# Patient Record
Sex: Female | Born: 1990 | Hispanic: No | Marital: Single | Smoking: Never smoker
Health system: Southern US, Community
[De-identification: ages and names within clinical notes are randomized; demographics above are authoritative.]

## PROBLEM LIST (undated history)

## (undated) DIAGNOSIS — Z789 Other specified health status: Secondary | ICD-10-CM

## (undated) HISTORY — DX: Other specified health status: Z78.9

---

## 2016-10-30 HISTORY — PX: OPEN REDUCTION SHOULDER DISLOCATION: SUR900

## 2017-10-01 ENCOUNTER — Encounter (HOSPITAL_COMMUNITY): Payer: Self-pay | Admitting: Emergency Medicine

## 2017-10-01 ENCOUNTER — Emergency Department (HOSPITAL_COMMUNITY)
Admission: EM | Admit: 2017-10-01 | Discharge: 2017-10-01 | Disposition: A | Discharge status: Home / Self Care | Payer: BLUE CROSS/BLUE SHIELD | Attending: Emergency Medicine | Admitting: Emergency Medicine

## 2017-10-01 ENCOUNTER — Emergency Department (HOSPITAL_COMMUNITY): Payer: BLUE CROSS/BLUE SHIELD

## 2017-10-01 DIAGNOSIS — J180 Bronchopneumonia, unspecified organism: Secondary | ICD-10-CM | POA: Diagnosis not present

## 2017-10-01 DIAGNOSIS — R079 Chest pain, unspecified: Secondary | ICD-10-CM | POA: Insufficient documentation

## 2017-10-01 DIAGNOSIS — R05 Cough: Secondary | ICD-10-CM | POA: Diagnosis present

## 2017-10-01 MED ORDER — BENZONATATE 100 MG PO CAPS: 100.0000 mg | ORAL_CAPSULE | Freq: Three times a day (TID) | ORAL | 0 refills | Status: DC

## 2017-10-01 MED ORDER — AZITHROMYCIN 250 MG PO TABS: 250.0000 mg | ORAL_TABLET | Freq: Every day | ORAL | 0 refills | Status: DC

## 2017-10-01 NOTE — ED Triage Notes (Signed)
At time of DC Pt upset and requested days off from work to get better.  Pt was provided  with another work note.

## 2017-10-01 NOTE — ED Provider Notes (Signed)
[facility]-EMERGENCY DEPT Provider Note   CSN: [id number] Arrival date & time: 10/01/17  1126     History   Chief Complaint Chief Complaint  Patient presents with  . Chest Pain  . Cough    HPI Andrea Mosley is a 26 y.o. female.  HPI Patient with no significant medical history comes in with chief complaint of cough. Patient reports having a productive cough with yellow phlegm for the past 4 or 5 days, and patient has associated midsternal chest discomfort. Patient's chest pain is worse with cough and with deep inspiration. Patient is having subjective fevers. She has had sick contacts.  History reviewed. No pertinent past medical history.  There are no active problems to display for this patient.   History reviewed. No pertinent surgical history.  OB History    No data available       Home Medications    Prior to Admission medications   Medication Sig Start Date End Date Taking? Authorizing Provider  PE-DM-APAP & Doxylamin-DM-APAP (VICKS DAYQUIL/NYQUIL CLD & FLU) (Liquid) MISC Take 30 mLs by mouth every 6 (six) hours as needed (cold and cough).   Yes [provider]  azithromycin (ZITHROMAX) 250 MG tablet Take 1 tablet (250 mg total) by mouth daily. Take first 2 tablets together, then 1 every day until finished. 10/01/17   Derwood Kaplan, MD  benzonatate (TESSALON) 100 MG capsule Take 1 capsule (100 mg total) by mouth every 8 (eight) hours. 10/01/17   Derwood Kaplan, MD    Family History No family history on file.  Social History Social History  Substance Use Topics  . Smoking status: Not on file  . Smokeless tobacco: Not on file  . Alcohol use Not on file     Allergies   Prednisone   Review of Systems Review of Systems  Constitutional: Positive for activity change.  Respiratory: Positive for cough.   Cardiovascular: Positive for chest pain.  Allergic/Immunologic: Negative for immunocompromised state.     Physical Exam Updated Vital  Signs BP 104/79 (BP Location: Left Arm)   Pulse 94   Temp 98.3 F (36.8 C) (Oral)   Resp 16   Ht  (1.727 m)   Wt 117.9 kg (260 lb)   LMP  (Within Months) Comment: Birth control   SpO2 96%   BMI 39.53 kg/m   Physical Exam  Constitutional: She is oriented to person, place, and time. She appears well-developed.  HENT:  Head: Normocephalic and atraumatic.  Eyes: EOM are normal.  Neck: Normal range of motion. Neck supple.  Cardiovascular: Normal rate.   Pulmonary/Chest: Effort normal.  rhonchi  Abdominal: Bowel sounds are normal.  Neurological: She is alert and oriented to person, place, and time.  Skin: Skin is warm and dry.  Nursing note and vitals reviewed.    ED Treatments / Results  Labs (all labs ordered are listed, but only abnormal results are displayed) Labs Reviewed - No data to display  EKG  EKG Interpretation None       Radiology Dg Chest 2 View  Result Date: 10/01/2017 CLINICAL DATA:  26 year old female with productive cough, chest pain, chills. EXAM: CHEST  2 VIEW COMPARISON:  None. FINDINGS: Normal lung volumes. Normal cardiac size and mediastinal contours. Visualized tracheal air column is within normal limits. Mildly asymmetric streaky left perihilar opacity. No consolidation or pleural effusion. No pneumothorax. The right lung appears clear. No acute osseous abnormality identified. Negative visible bowel gas pattern. IMPRESSION: Asymmetric streaky left perihilar opacity favored  due to left lung bronchopneumonia in this clinical setting. No pleural effusion. Electronically Signed   By: Odessa Fleming M.D.   On: 10/01/2017 13:45    Procedures Procedures (including critical care time)  Medications Ordered in ED Medications - No data to display   Initial Impression / Assessment and Plan / ED Course  I have reviewed the triage vital signs and the nursing notes.  Pertinent labs & imaging results that were available during my care of the patient were  reviewed by me and considered in my medical decision making (see chart for details).     Patient comes in with chief complaint of cough, she has some rhonchus breath sounds. Cough is productive with thick yellow sputum. Chest x-ray does show evidence of bronchopneumonia on the left side. We'll start patient on CAP Treatment.  Final Clinical Impressions(s) / ED Diagnoses   Final diagnoses:  Bronchopneumonia    New Prescriptions New Prescriptions   AZITHROMYCIN (ZITHROMAX) 250 MG TABLET    Take 1 tablet (250 mg total) by mouth daily. Take first 2 tablets together, then 1 every day until finished.   BENZONATATE (TESSALON) 100 MG CAPSULE    Take 1 capsule (100 mg total) by mouth every 8 (eight) hours.     Derwood Kaplan, MD 10/01/17 [phone]

## 2017-10-01 NOTE — Discharge Instructions (Signed)
Xrays show an infection of the lung. Please start the antibiotics prescribed.  Return to the ER if the symptoms get worse. See your primary doctor in 1 week.

## 2017-10-01 NOTE — ED Triage Notes (Signed)
Pt reports central chest pain, chills and productive cough, resp e/u, nad.

## 2017-10-01 NOTE — ED Notes (Signed)
Declined W/C at D/C and was escorted to lobby by RN. 

## 2017-10-12 IMAGING — DX DG CHEST 2V
2 series · 2 of 2 positions shown · non-contrast
Comparison: None.

CLINICAL DATA: 25-year-old female with productive cough, chest
pain, chills.

EXAM:
CHEST  2 VIEW

[w chest lat]
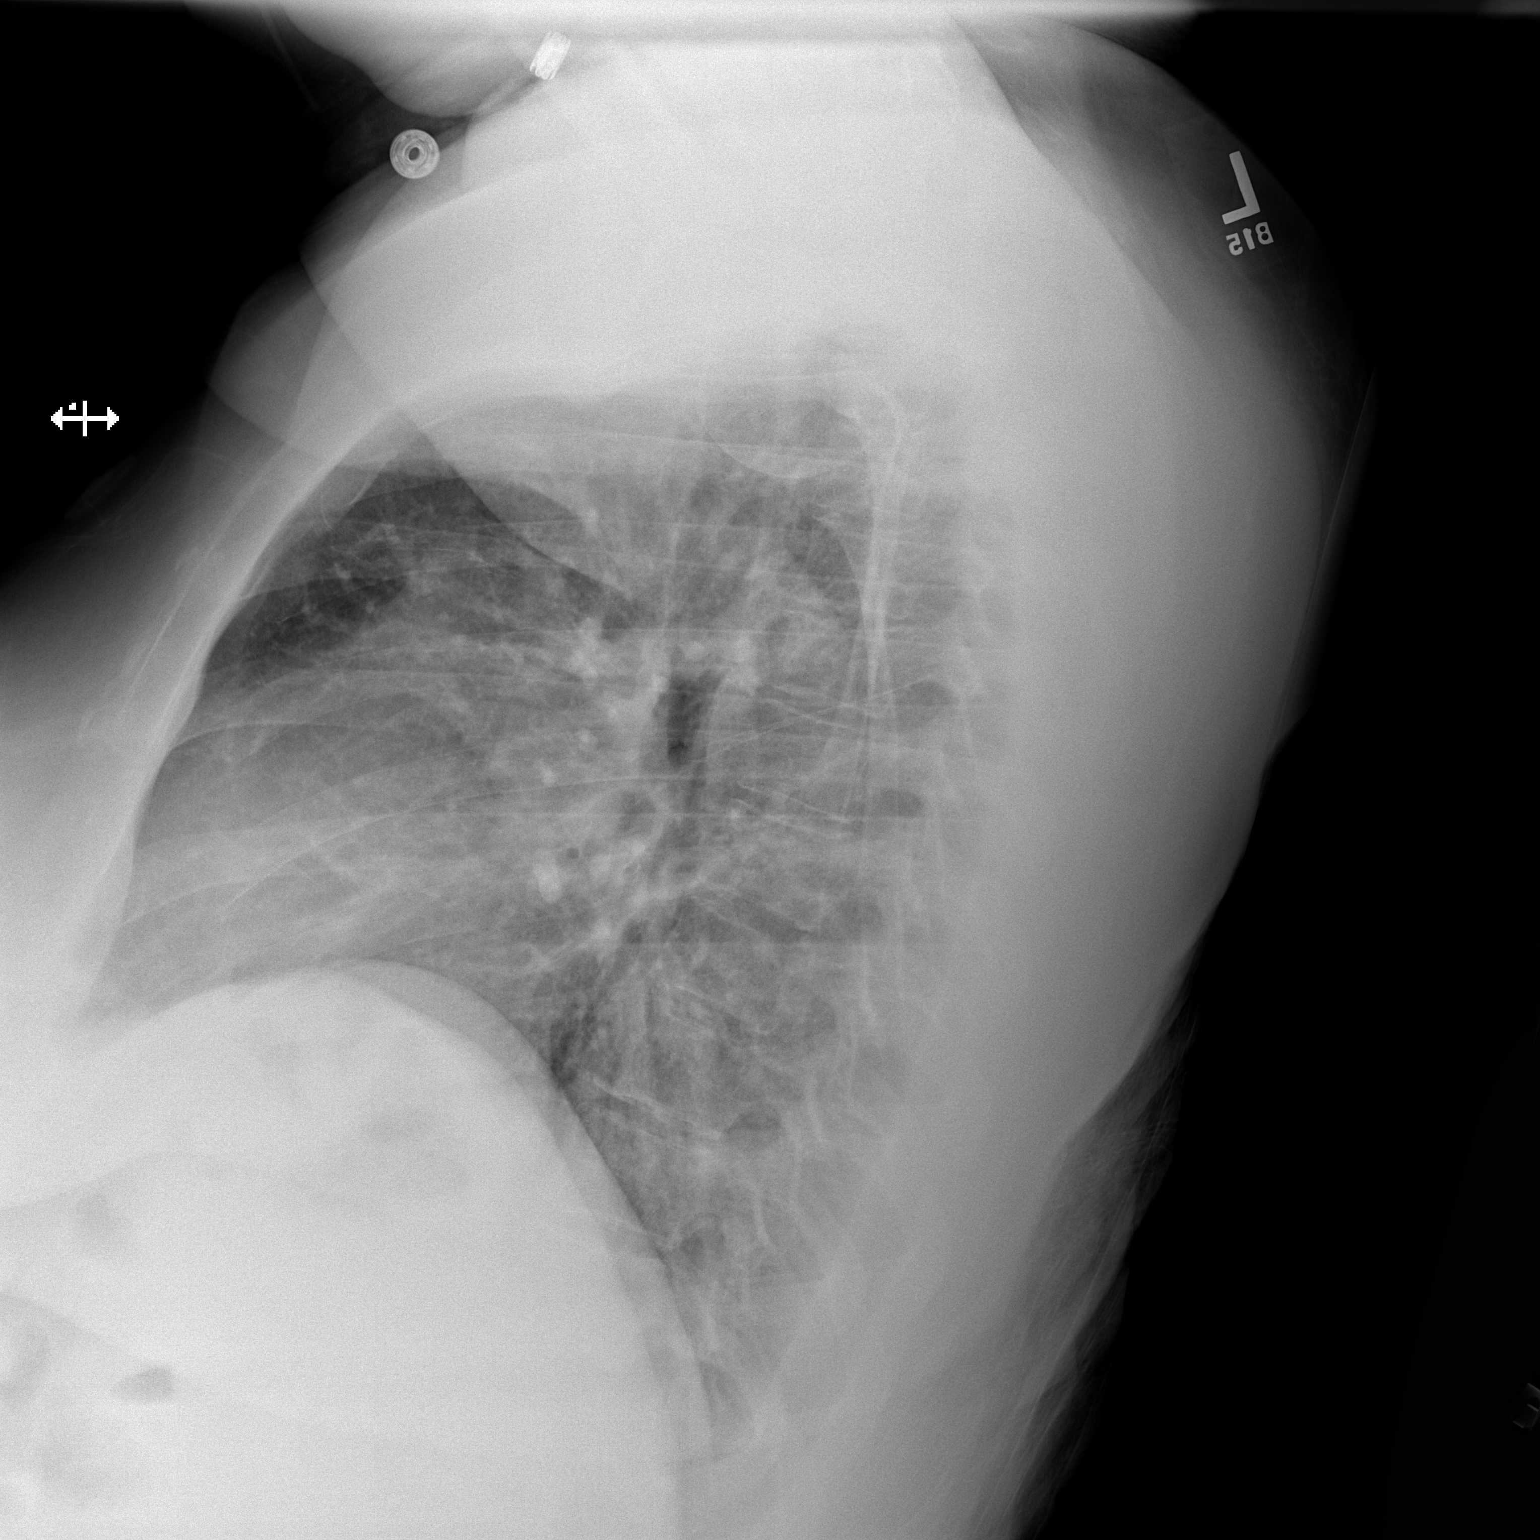

[w chest pa]
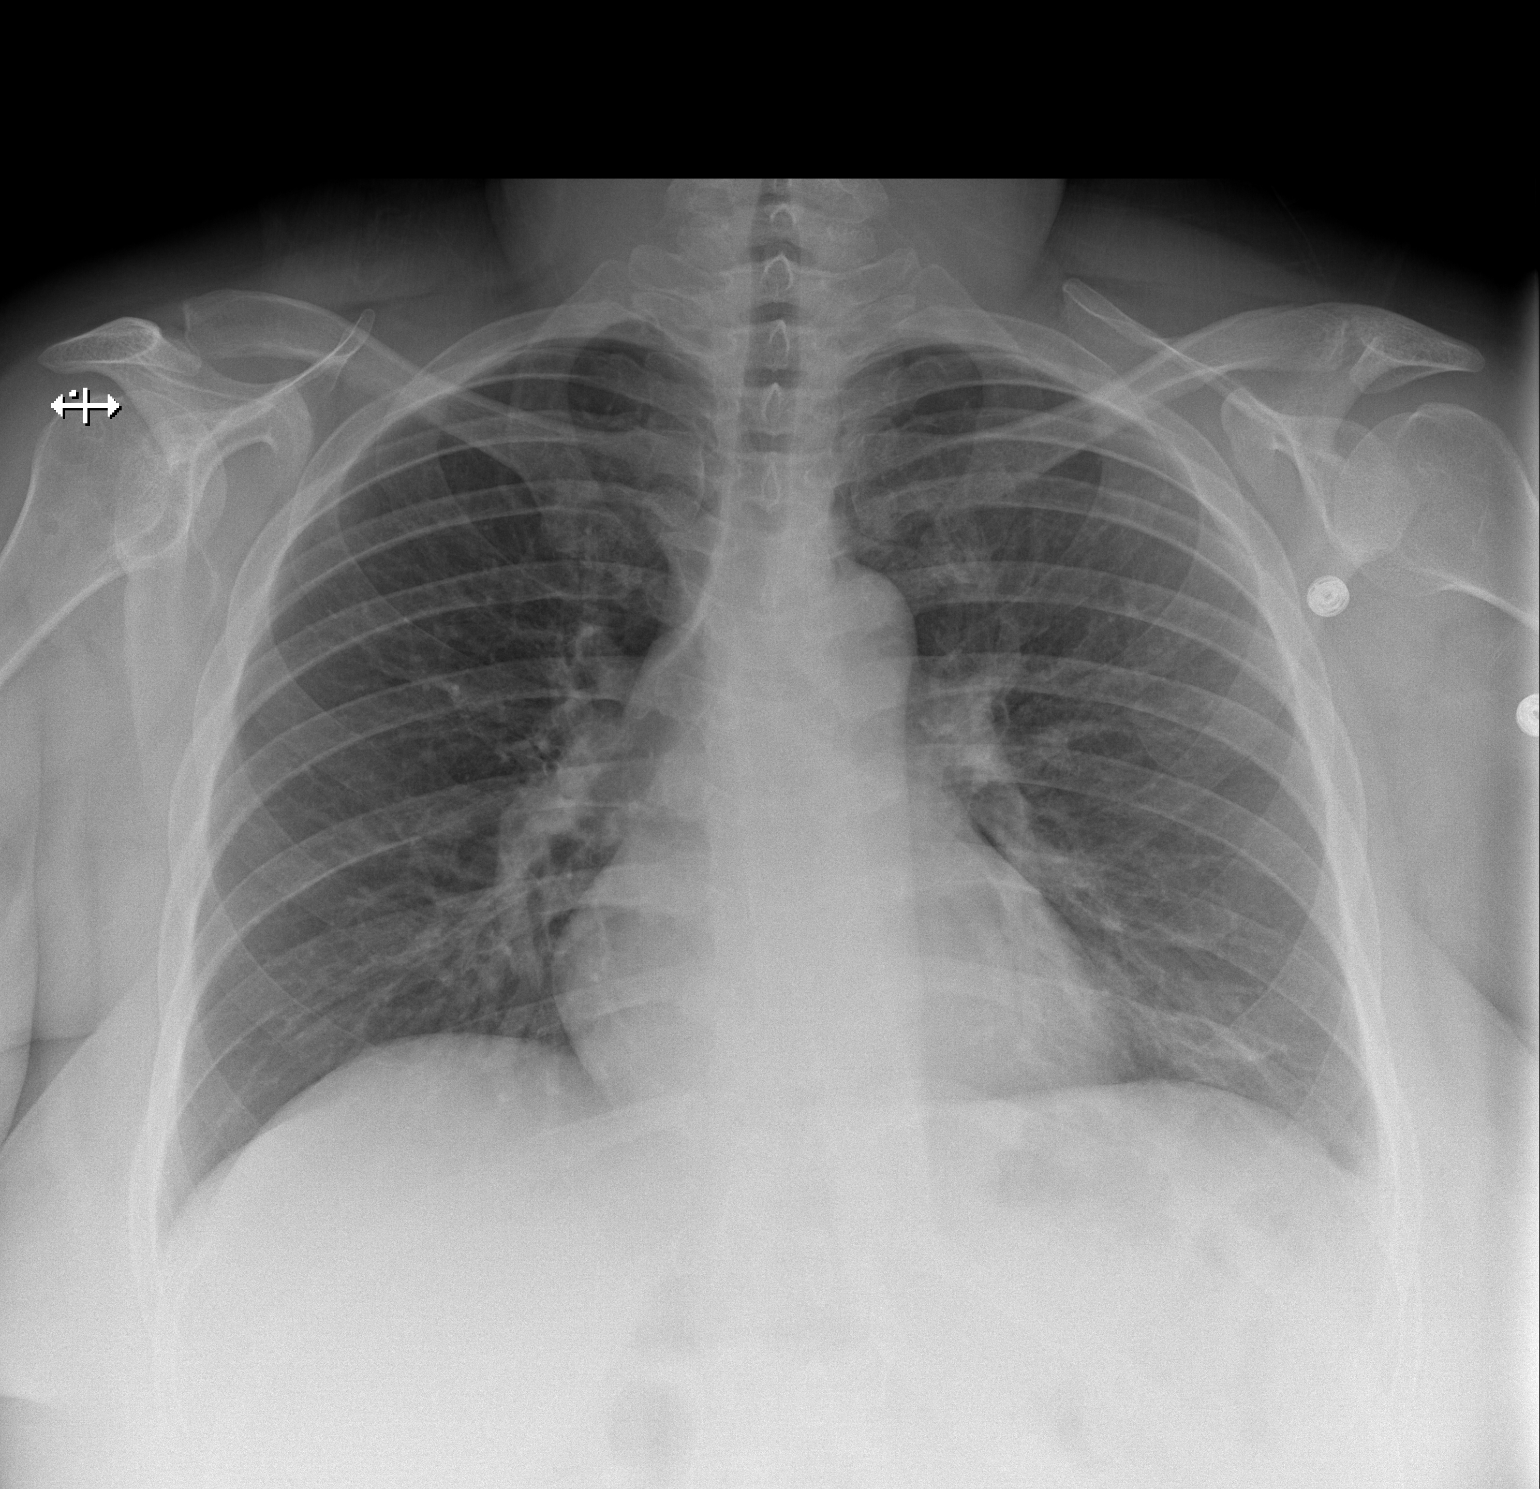

[2 of 2 positions shown; findings below may reference images not displayed]

FINDINGS: Normal lung volumes. Normal cardiac size and mediastinal contours.
Visualized tracheal air column is within normal limits. Mildly
asymmetric streaky left perihilar opacity. No consolidation or
pleural effusion. No pneumothorax. The right lung appears clear. No
acute osseous abnormality identified. Negative visible bowel gas
pattern.
IMPRESSION: Asymmetric streaky left perihilar opacity favored due to left lung
bronchopneumonia in this clinical setting. No pleural effusion.

## 2017-12-26 ENCOUNTER — Other Ambulatory Visit: Payer: Self-pay

## 2017-12-26 ENCOUNTER — Encounter (HOSPITAL_COMMUNITY): Payer: Self-pay | Admitting: *Deleted

## 2017-12-26 ENCOUNTER — Emergency Department (HOSPITAL_COMMUNITY)
Admission: EM | Admit: 2017-12-26 | Discharge: 2017-12-26 | Discharge status: Against Medical Advice | Payer: BLUE CROSS/BLUE SHIELD | Attending: Emergency Medicine | Admitting: Emergency Medicine

## 2017-12-26 DIAGNOSIS — M25511 Pain in right shoulder: Secondary | ICD-10-CM | POA: Diagnosis not present

## 2017-12-26 DIAGNOSIS — Z5321 Procedure and treatment not carried out due to patient leaving prior to being seen by health care provider: Secondary | ICD-10-CM | POA: Insufficient documentation

## 2017-12-26 NOTE — ED Triage Notes (Signed)
Registration notified triage RN that pt left from waiting room.
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
## 2017-12-27 ENCOUNTER — Encounter (HOSPITAL_COMMUNITY): Payer: Self-pay | Admitting: Emergency Medicine

## 2018-09-03 ENCOUNTER — Emergency Department (HOSPITAL_COMMUNITY)
Admission: EM | Admit: 2018-09-03 | Discharge: 2018-09-04 | Disposition: A | Discharge status: Home / Self Care | Payer: BLUE CROSS/BLUE SHIELD | Attending: Emergency Medicine | Admitting: Emergency Medicine

## 2018-09-03 ENCOUNTER — Encounter (HOSPITAL_COMMUNITY): Payer: Self-pay | Admitting: Emergency Medicine

## 2018-09-03 ENCOUNTER — Other Ambulatory Visit: Payer: Self-pay

## 2018-09-03 DIAGNOSIS — Y93G1 Activity, food preparation and clean up: Secondary | ICD-10-CM | POA: Diagnosis not present

## 2018-09-03 DIAGNOSIS — Y999 Unspecified external cause status: Secondary | ICD-10-CM | POA: Insufficient documentation

## 2018-09-03 DIAGNOSIS — Y929 Unspecified place or not applicable: Secondary | ICD-10-CM | POA: Diagnosis not present

## 2018-09-03 DIAGNOSIS — S61213A Laceration without foreign body of left middle finger without damage to nail, initial encounter: Secondary | ICD-10-CM | POA: Diagnosis present

## 2018-09-03 DIAGNOSIS — W260XXA Contact with knife, initial encounter: Secondary | ICD-10-CM | POA: Insufficient documentation

## 2018-09-03 NOTE — ED Provider Notes (Signed)
Patient placed in Quick Look pathway, seen and evaluated   Chief Complaint: Finger laceration  HPI:   Patient states she sustained a laceration to the palmar aspect of the left third digit at around 8:30 AM this morning while chopping vegetables for her children.  Notes numbness and tingling to the digit.  Up-to-date on her tetanus.  No fevers.  Notes 8/10 pain to the site of the laceration as well as worsening pain with flexion and decreased range of motion secondary to swelling.  She is right-hand dominant.  ROS: Positive for wound, numbness Negative for fevers  Physical Exam:   Gen: No distress  Neuro: Awake and Alert  Skin: Warm    Focused Exam: 2.5 cm linear laceration noted to the ulnar aspect of the proximal left third digit.  Bleeding controlled.  Subcutaneous tissue noted.  Decreased active range of motion secondary to swelling and pain but she exhibits 5/5 strength of wrist and digits with flexion and extension against resistance bilaterally.  2+ radial pulses bilaterally.   Initiation of care has begun. The patient has been counseled on the process, plan, and necessity for staying for the completion/evaluation, and the remainder of the medical screening examination    Bennye Alm 09/03/18 2049    Lorre Nick, MD 09/04/18 [phone]

## 2018-09-03 NOTE — ED Triage Notes (Signed)
Pt reports laceration to left middle finger and rt pointer finger that happened at 0830 today. Lac about 2-3cm. Bleeding controlled. Swelling noted to lac area, pt able to move fingers. Tetanus updated.

## 2018-09-04 MED ORDER — CEPHALEXIN 500 MG PO CAPS: 500.0000 mg | ORAL_CAPSULE | Freq: Four times a day (QID) | ORAL | 0 refills | Status: DC

## 2018-09-04 MED ORDER — LIDOCAINE HCL (PF) 1 % IJ SOLN
5.0000 mL | Freq: Once | INTRAMUSCULAR | Status: AC
  Administered 2018-09-04: 5 mL
  Filled 2018-09-04: qty 5

## 2018-09-04 MED ORDER — NAPROXEN 500 MG PO TABS: 500.0000 mg | ORAL_TABLET | Freq: Two times a day (BID) | ORAL | 0 refills | Status: DC

## 2018-09-04 NOTE — ED Provider Notes (Signed)
[facility] EMERGENCY DEPARTMENT Provider Note   CSN: [id number] Arrival date & time: 09/03/18  2039     History   Chief Complaint Chief Complaint  Patient presents with  . Laceration    HPI Andrea Mosley is a 27 y.o. female with no significant past medical history presents emergency department today for laceration to her left, nondominant third digit that occurred approximately 8:30 AM this morning while she was cutting potatoes.  Patient reports that she has cleansed the area.  Her tetanus is up-to-date.  She notes some numbness and tingling distal to the finger.  Rates her current pain is 8/10.  Pain is worsened with movement.  Some suture range of motion secondary to swelling but is able to flex and extend.  HPI  History reviewed. No pertinent past medical history.  There are no active problems to display for this patient.   Past Surgical History:  Procedure Laterality Date  . OPEN REDUCTION SHOULDER DISLOCATION Right 10/30/2016     OB History   None      Home Medications    Prior to Admission medications   Medication Sig Start Date End Date Taking? Authorizing Provider  azithromycin (ZITHROMAX) 250 MG tablet Take 1 tablet (250 mg total) by mouth daily. Take first 2 tablets together, then 1 every day until finished. 10/01/17   Derwood Kaplan, MD  benzonatate (TESSALON) 100 MG capsule Take 1 capsule (100 mg total) by mouth every 8 (eight) hours. 10/01/17   Derwood Kaplan, MD  PE-DM-APAP & Doxylamin-DM-APAP (VICKS DAYQUIL/NYQUIL CLD & FLU) (Liquid) MISC Take 30 mLs by mouth every 6 (six) hours as needed (cold and cough).    [provider]    Family History No family history on file.  Social History Social History   Tobacco Use  . Smoking status: Never Smoker  . Smokeless tobacco: Never Used  Substance Use Topics  . Alcohol use: No    Frequency: Never  . Drug use: No     Allergies   Prednisone and  Prednisone   Review of Systems Review of Systems  Constitutional: Negative for fever.  Musculoskeletal: Negative for arthralgias.  Skin: Positive for wound.  Neurological: Negative for weakness.  All other systems reviewed and are negative.    Physical Exam Updated Vital Signs Pulse 92   Temp 98 F (36.7 C) (Oral)   Resp 16   Ht 5\' 8"  (1.727 m)   Wt 118.8 kg   SpO2 100%   BMI 39.84 kg/m   Physical Exam  Constitutional: She appears well-developed and well-nourished.  HENT:  Head: Normocephalic and atraumatic.  Right Ear: External ear normal.  Left Ear: External ear normal.  Eyes: Conjunctivae are normal. Right eye exhibits no discharge. Left eye exhibits no discharge. No scleral icterus.  Cardiovascular:  Pulses:      Radial pulses are 2+ on the right side.  Pulmonary/Chest: Effort normal. No respiratory distress.  Musculoskeletal:  Left 3rd digit: There is a 2.5cm laceration to the palmar aspect just proximal to the PIP. There is noted soft tissue swelling. No tendon exposure.  Finger adduction/abduction intact with 5/5 strength.  Active and resisted ROM to MCP, PIP and DIP intact but limited 2/2 pain and swelling.  FDS/FDP intact. Radial artery 2+ with <2sec cap refill. SILT in M/U/R distributions.  Neurological: She is alert. She has normal strength. No sensory deficit.  Skin: Skin is warm and dry. Capillary refill takes less than 2 seconds. Laceration noted. No  pallor.  Psychiatric: She has a normal mood and affect.  Nursing note and vitals reviewed.    ED Treatments / Results  Labs (all labs ordered are listed, but only abnormal results are displayed) Labs Reviewed - No data to display  EKG None  Radiology No results found.  Procedures .Marland KitchenLaceration Repair Date/Time: 09/04/2018 12:46 AM Performed by: Jacinto Halim, PA-C Authorized by: Jacinto Halim, PA-C   Consent:    Consent obtained:  Verbal   Consent given by:  Patient   Risks discussed:   Infection, need for additional repair, nerve damage, poor wound healing, poor cosmetic result, pain, retained foreign body, tendon damage and vascular damage   Alternatives discussed:  No treatment Anesthesia (see MAR for exact dosages):    Anesthesia method:  Nerve block   Block location:  Digital   Block needle gauge:  25 G   Block anesthetic:  Lidocaine 1% w/o epi   Block technique:  Digital   Block injection procedure:  Anatomic landmarks identified, anatomic landmarks palpated, negative aspiration for blood, introduced needle and incremental injection   Block outcome:  Anesthesia achieved Laceration details:    Location:  Finger   Finger location:  L long finger   Length (cm):  2.5 Repair type:    Repair type:  Simple Pre-procedure details:    Preparation:  Patient was prepped and draped in usual sterile fashion Exploration:    Hemostasis achieved with:  Direct pressure   Wound exploration: wound explored through full range of motion and entire depth of wound probed and visualized     Wound extent: no foreign bodies/material noted and no tendon damage noted   Treatment:    Area cleansed with:  Soap and water   Amount of cleaning:  Extensive   Irrigation solution:  Tap water and sterile water   Irrigation volume:  1000   Irrigation method:  Tap and syringe   Visualized foreign bodies/material removed: no   Skin repair:    Repair method:  Sutures   Suture size:  5-0   Wound skin closure material used: ethilon.   Suture technique:  Simple interrupted   Number of sutures:  4 Approximation:    Approximation:  Close Post-procedure details:    Dressing:  Non-adherent dressing and splint for protection   Patient tolerance of procedure:  Tolerated well, no immediate complications   (including critical care time)  Medications Ordered in ED Medications  lidocaine (PF) (XYLOCAINE) 1 % injection 5 mL (5 mLs Infiltration Given 09/04/18 0022)     Initial Impression / Assessment  and Plan / ED Course  I have reviewed the triage vital signs and the nursing notes.  Pertinent labs & imaging results that were available during my care of the patient were reviewed by me and considered in my medical decision making (see chart for details).     27 y.o. female with laceration to left, non-dominant 3rd digit. No evidence of tendon injury and she is NVI. Pressure irrigation performed. Wound explored and base of wound visualized in a bloodless field without evidence of foreign body.  Laceration occurred < 18 hours prior to repair which was well tolerated. Tdap is up to date.  Will d/c with abx.  Discussed suture home care with patient and answered questions. Pt to follow-up for wound check and suture removal in 7 days; they are to return to the ED sooner for signs of infection. Pt is hemodynamically stable with no complaints prior to dc.  Final Clinical Impressions(s) / ED Diagnoses   Final diagnoses:  Laceration of left middle finger without foreign body without damage to nail, initial encounter    ED Discharge Orders         Ordered    cephALEXin (KEFLEX) 500 MG capsule  4 times daily     09/04/18 0046    naproxen (NAPROSYN) 500 MG tablet  2 times daily     09/04/18 0046           Jacinto Halim, PA-C 09/04/18 0049    Shon Baton, MD 09/04/18 [phone]

## 2023-07-30 ENCOUNTER — Encounter (HOSPITAL_COMMUNITY): Payer: Self-pay

## 2023-07-30 ENCOUNTER — Emergency Department (HOSPITAL_COMMUNITY)
Admission: EM | Admit: 2023-07-30 | Discharge: 2023-07-30 | Disposition: A | Discharge status: Home / Self Care | Payer: BC Managed Care – PPO | Attending: Emergency Medicine | Admitting: Emergency Medicine

## 2023-07-30 ENCOUNTER — Other Ambulatory Visit: Payer: Self-pay

## 2023-07-30 DIAGNOSIS — K029 Dental caries, unspecified: Secondary | ICD-10-CM | POA: Diagnosis not present

## 2023-07-30 DIAGNOSIS — K0889 Other specified disorders of teeth and supporting structures: Secondary | ICD-10-CM | POA: Diagnosis present

## 2023-07-30 MED ORDER — AMOXICILLIN-POT CLAVULANATE 875-125 MG PO TABS: 1.0000 | ORAL_TABLET | Freq: Two times a day (BID) | ORAL | 0 refills | Status: DC

## 2023-07-30 MED ORDER — IBUPROFEN 600 MG PO TABS: 600.0000 mg | ORAL_TABLET | Freq: Four times a day (QID) | ORAL | 0 refills | Status: DC | PRN

## 2023-07-30 MED ORDER — CHLORHEXIDINE GLUCONATE 0.12 % MT SOLN: 15.0000 mL | Freq: Two times a day (BID) | OROMUCOSAL | 0 refills | Status: DC

## 2023-07-30 NOTE — ED Triage Notes (Signed)
Pt presenting with a cracked tooth that has been causing pain since Saturday.

## 2023-07-30 NOTE — Discharge Instructions (Addendum)
Take the antibiotic as prescribed for the entire course  Follow-up with dentistry have given you the information for an office  Please use Tylenol or ibuprofen for pain.  You may use 600 mg ibuprofen every 6 hours or 1000 mg of Tylenol every 6 hours.  You may choose to alternate between the 2.  This would be most effective.  Not to exceed 4 g of Tylenol within 24 hours.  Not to exceed 3200 mg ibuprofen 24 hours.

## 2023-07-30 NOTE — ED Provider Notes (Signed)
[place] EMERGENCY DEPARTMENT AT [facility] Provider Note   CSN: [id number] Arrival date & time: 07/30/23  1122     History   Chief Complaint Chief Complaint  Patient presents with   Dental Pain    HPI Andrea Mosley is a 32 y.o. female.  Patient presents to the emergency department with a dental complaint. Symptoms began 4 days ago. The patient has tried to alleviate pain with ibuprofen.  Pain rated as severe, characterized as throbbing in nature and located R lower dentition. Patient denies fever, night sweats, chills, difficulty swallowing or opening mouth, SOB, nuchal rigidity or decreased ROM of neck.  Patient does not have a dentist and requests a resource guide at discharge.       Dental Pain   History reviewed. No pertinent past medical history.  There are no problems to display for this patient.   Past Surgical History:  Procedure Laterality Date   OPEN REDUCTION SHOULDER DISLOCATION Right 10/30/2016     OB History   No obstetric history on file.      Home Medications    Prior to Admission medications   Medication Sig Start Date End Date Taking? Authorizing Provider  amoxicillin-clavulanate (AUGMENTIN) 875-125 MG tablet Take 1 tablet by mouth every 12 (twelve) hours. 07/30/23  Yes Adisyn Ruscitti S, PA  chlorhexidine (PERIDEX) 0.12 % solution Use as directed 15 mLs in the mouth or throat 2 (two) times daily. 07/30/23  Yes Joscelin Fray S, PA  ibuprofen (ADVIL) 600 MG tablet Take 1 tablet (600 mg total) by mouth every 6 (six) hours as needed. 07/30/23  Yes Gailen Shelter, PA    Family History History reviewed. No pertinent family history.  Social History Social History   Tobacco Use   Smoking status: Never   Smokeless tobacco: Never  Vaping Use   Vaping status: Never Used  Substance Use Topics   Alcohol use: No   Drug use: No     Allergies   Prednisone and Prednisone   Review of Systems Denies fevers, chills, difficulty  swallowing or eating, changes in voice, pain under tongue, nausea, vomiting, lightheadedness or dizziness. No trismus   Physical Exam Updated Vital Signs BP (!) 123/97 (BP Location: Left Arm)   Pulse 60   Temp 98 F (36.7 C) (Oral)   Resp 16   Ht 5\' 8"  (1.727 m)   Wt 127 kg   SpO2 100%   BMI 42.57 kg/m   Physical Exam Physical Exam  Constitutional: Pt appears well-developed and well-nourished.  HENT:  Head: Normocephalic.  Right Ear: Tympanic membrane, external ear and ear canal normal.  Left Ear: Tympanic membrane, external ear and ear canal normal.  Nose: Nose normal. Right sinus exhibits no maxillary sinus tenderness and no frontal sinus tenderness. Left sinus exhibits no maxillary sinus tenderness and no frontal sinus tenderness.  Mouth/Throat: Uvula is midline, oropharynx is clear and moist and mucous membranes are normal. No oral lesions. No uvula swelling or lacerations. No oropharyngeal exudate, posterior oropharyngeal edema, posterior oropharyngeal erythema or tonsillar abscesses.  Poor dentition No gingival swelling, fluctuance or induration No gross abscess but there is area of tenderness and swelling just below overloaded right lower premolar.  No sublingual edema  Pain at right lower gumline -- partially eroded right premolar. Eyes: Conjunctivae are normal. Pupils are equal, round, and reactive to light. Right eye exhibits no discharge. Left eye exhibits no discharge.  Neck: Normal range of motion. Neck supple.  No stridor  Handling secretions without difficulty No nuchal rigidity No cervical lymphadenopathy Cardiovascular: Normal rate, regular rhythm and normal heart sounds.   Pulmonary/Chest: Effort normal. No respiratory distress.  Equal chest rise  Abdominal: Soft. Bowel sounds are normal. Pt exhibits no distension. There is no tenderness.  Lymphadenopathy: Pt has no cervical adenopathy.  Neurological: Pt is alert and oriented x 4  Skin: Skin is warm and  dry.  Psychiatric: Pt has a normal mood and affect.  Nursing note and vitals reviewed.   ED Treatments / Results  Labs (all labs ordered are listed, but only abnormal results are displayed) Labs Reviewed - No data to display  EKG    Radiology No results found.  Procedures Procedures (including critical care time)  Medications Ordered in ED Medications - No data to display   Initial Impression / Assessment and Plan / ED Course  I have reviewed the triage vital signs and the nursing notes.  Pertinent labs & imaging results that were available during my care of the patient were reviewed by me and considered in my medical decision making (see chart for details).        Patient with dentalgia.  No abscess requiring immediate incision and drainage.  Exam not concerning for Ludwig's angina or pharyngeal abscess.  Will treat with augmentin and ibuprofen - given lidocaine pops as well. Pt instructed to follow-up with dentist.  Discussed return precautions. Pt safe for discharge.   Final Clinical Impressions(s) / ED Diagnoses   Final diagnoses:  Infected dental caries    ED Discharge Orders          Ordered    amoxicillin-clavulanate (AUGMENTIN) 875-125 MG tablet  Every 12 hours        07/30/23 1236    ibuprofen (ADVIL) 600 MG tablet  Every 6 hours PRN        07/30/23 1236    chlorhexidine (PERIDEX) 0.12 % solution  2 times daily        07/30/23 1236              Solon [place], [place] 07/30/23 1237    Ernie Avena, MD 07/30/23 1429

## 2023-12-26 NOTE — L&D Delivery Note (Signed)
 [facility] Delivery Note  Destane Speas Partridge is a 33 y.o. [id number] s/p SVD at [redacted]w[redacted]d. She was admitted for IOL.   ROM: 3h 47m with clear fluid GBS Status: positive, adequate ppx given Maximum Maternal Temperature: 98.68F  Labor Progress: Initial SVE 1/thick/high, augmented with misoprostol, pitocin and AROM. Progressed to fully dilated/+2  Delivery Date/Time: 11/22/2024 1226 Delivery: Called to room and patient was complete and pushing. Head delivered LOA with compound presentation of hand. No nuchal cord present. Shoulder and body delivered in usual fashion. Infant with spontaneous cry, placed on mother's abdomen, dried and stimulated. Cord clamped x 2 after 1-minute delay, and cut by doula. Cord blood drawn. Placenta delivered via manual extraction, with 3-vessel cord. Fundus firm with massage and Pitocin. Labia, perineum, vagina, and cervix inspected, no lacerations found.   Placenta: Manually removed, intact Complications: Manual removal of placenta Lacerations: None EBL: Analgesia: Epidural  Infant: Viable female  APGARs 9, 9  4540g  Charlie DELENA Courts, MD 11/22/2024, 1:03 PM

## 2024-03-28 ENCOUNTER — Ambulatory Visit (INDEPENDENT_AMBULATORY_CARE_PROVIDER_SITE_OTHER): Payer: Self-pay | Admitting: Obstetrics and Gynecology

## 2024-03-28 ENCOUNTER — Other Ambulatory Visit: Payer: Self-pay

## 2024-03-28 VITALS — BP 104/67 | HR 73 | Wt 330.7 lb

## 2024-03-28 DIAGNOSIS — O99210 Obesity complicating pregnancy, unspecified trimester: Secondary | ICD-10-CM | POA: Insufficient documentation

## 2024-03-28 DIAGNOSIS — R7303 Prediabetes: Secondary | ICD-10-CM | POA: Diagnosis not present

## 2024-03-28 DIAGNOSIS — Z131 Encounter for screening for diabetes mellitus: Secondary | ICD-10-CM | POA: Diagnosis not present

## 2024-03-28 DIAGNOSIS — O09291 Supervision of pregnancy with other poor reproductive or obstetric history, first trimester: Secondary | ICD-10-CM | POA: Insufficient documentation

## 2024-03-28 DIAGNOSIS — Z3202 Encounter for pregnancy test, result negative: Secondary | ICD-10-CM

## 2024-03-28 DIAGNOSIS — Z3490 Encounter for supervision of normal pregnancy, unspecified, unspecified trimester: Secondary | ICD-10-CM | POA: Diagnosis not present

## 2024-03-28 DIAGNOSIS — O3663X0 Maternal care for excessive fetal growth, third trimester, not applicable or unspecified: Secondary | ICD-10-CM | POA: Insufficient documentation

## 2024-03-28 DIAGNOSIS — Z3200 Encounter for pregnancy test, result unknown: Secondary | ICD-10-CM

## 2024-03-28 LAB — POCT PREGNANCY, URINE: Preg Test, Ur: POSITIVE — AB

## 2024-03-28 LAB — HEMOGLOBIN A1C
Est. average glucose Bld gHb Est-mCnc: 120 mg/dL
Hgb A1c MFr Bld: 5.8 % — ABNORMAL HIGH (ref 4.8–5.6)

## 2024-03-28 MED ORDER — PRENATAL PLUS VITAMIN/MINERAL 27-1 MG PO TABS: 1.0000 | ORAL_TABLET | Freq: Every day | ORAL | 11 refills | Status: AC

## 2024-03-28 NOTE — Progress Notes (Signed)
  History:  [name] is a 33 y.o. [phone] who presents to clinic today with complaint of possible pregnancy. She has no vaginal bleeding or pain.    History reviewed. No pertinent past medical history.  Past Surgical History:  Procedure Laterality Date   OPEN REDUCTION SHOULDER DISLOCATION Right 10/30/2016    The following portions of the patient's history were reviewed and updated as appropriate: allergies, current medications, past family history, past medical history, past social history, past surgical history and problem list.   Review of Systems:  Pertinent items noted in HPI and remainder of comprehensive ROS otherwise negative.  Objective:  Physical Exam BP 104/67   Pulse 73   Wt (!) 330 lb 11.2 oz (150 kg)   LMP 02/15/2024 (Exact Date)   BMI 50.28 kg/m  Physical Exam Constitutional:      Appearance: She is obese.  HENT:     Head: Normocephalic and atraumatic.     Nose: Nose normal.  Eyes:     Extraocular Movements: Extraocular movements intact.     Conjunctiva/sclera: Conjunctivae normal.     Pupils: Pupils are equal, round, and reactive to light.  Cardiovascular:     Rate and Rhythm: Normal rate.  Pulmonary:     Effort: Pulmonary effort is normal.  Musculoskeletal:     Cervical back: Normal range of motion.  Neurological:     General: No focal deficit present.     Mental Status: She is alert and oriented to person, place, and time. Mental status is at baseline.  Psychiatric:        Mood and Affect: Mood normal.        Behavior: Behavior normal.        Thought Content: Thought content normal.        Judgment: Judgment normal.      Labs and Imaging Results for orders placed or performed in visit on 03/28/24 (from the past 24 hours)  Pregnancy, urine POC     Status: Abnormal   Collection Time: 03/28/24  8:56 AM  Result Value Ref Range   Preg Test, Ur POSITIVE (A) NEGATIVE    No results found.   Assessment & Plan:  1. Possible  pregnancy (Primary) UPT was positive. Welcomed to practice. Discussed A1C and if abnormal would check 2 hr gtt.  Discussed weight gain and exercise in pregnancy.   2. Screening for diabetes mellitus (DM) - HgB A1c  3. Pregnancy with uncertain dates, antepartum - Prenatal Vit-Fe Fumarate-FA (PRENATAL PLUS VITAMIN/MINERAL) 27-1 MG TABS; Take 1 tablet by mouth daily.  Dispense: 30 tablet; Refill: 11 - US OB LESS THAN 14 WEEKS WITH OB TRANSVAGINAL; Future    Approximately 15 minutes of total time was spent with this patient on history taking, coordination of care, education and documentation.   [name], MD 03/28/2024 10:16 AM

## 2024-03-28 NOTE — Patient Instructions (Signed)

## 2024-03-28 NOTE — Progress Notes (Signed)
 Possible Pregnancy  Here today for pregnancy confirmation. UPT in office today is positive. Has regular menstrual periods with exception of January following Plan B pill taken in December 2024. Reviewed dating with patient:   LMP: 02/15/24 EDD: 11/21/24 6w 0d today  OB history reviewed. Reviewed medications and allergies with patient. Recommended pt begin prenatal vitamin and schedule prenatal care. [name], MD to bedside for brief visit.  Marjo Bicker, RN 03/28/2024  8:34 AM

## 2024-03-29 ENCOUNTER — Encounter: Payer: Self-pay | Admitting: Obstetrics and Gynecology

## 2024-03-29 DIAGNOSIS — R7303 Prediabetes: Secondary | ICD-10-CM

## 2024-04-01 ENCOUNTER — Telehealth: Payer: Self-pay

## 2024-04-01 NOTE — Telephone Encounter (Signed)
-----   Message from [name] sent at 03/29/2024 11:08 AM EDT ----- Needs early 2 hr at new ob.   Left VM to return call in regards to results and f/u appt.   Andrea Mosley

## 2024-04-02 NOTE — Telephone Encounter (Signed)
 Called and spoke with patient. She reports she is in class and will call back this afternoon.

## 2024-04-07 ENCOUNTER — Other Ambulatory Visit: Payer: Self-pay

## 2024-04-09 LAB — OB RESULTS CONSOLE ABO/RH: RH Type: POSITIVE

## 2024-04-09 LAB — OB RESULTS CONSOLE ANTIBODY SCREEN: Antibody Screen: NEGATIVE

## 2024-04-09 LAB — OB RESULTS CONSOLE RUBELLA ANTIBODY, IGM: Rubella: IMMUNE

## 2024-04-09 LAB — OB RESULTS CONSOLE HGB/HCT, BLOOD
HCT: 37 (ref 29–41)
Hemoglobin: 12.8

## 2024-04-09 LAB — OB RESULTS CONSOLE HIV ANTIBODY (ROUTINE TESTING): HIV: NONREACTIVE

## 2024-04-09 LAB — HEPATITIS C ANTIBODY: HCV Ab: NEGATIVE

## 2024-04-09 LAB — OB RESULTS CONSOLE RPR: RPR: NONREACTIVE

## 2024-04-09 LAB — OB RESULTS CONSOLE HEPATITIS B SURFACE ANTIGEN: Hepatitis B Surface Ag: NEGATIVE

## 2024-04-09 LAB — OB RESULTS CONSOLE PLATELET COUNT: Platelets: 271

## 2024-04-10 NOTE — Telephone Encounter (Signed)
 Attempt to call patient to set up early 2 hours appointment. No answer and left a voicemail.  Will send a MyChart mesasge.    Honore Lux CMA

## 2024-04-18 NOTE — Telephone Encounter (Signed)
 Spoke to patient and informed on lab results and need an early 2 hours lab GTT.  Pt. Is scheduled on 04/22/2024.   Honore Lux CMA

## 2024-04-18 NOTE — Telephone Encounter (Signed)
 Spoke to patient and scheduled a early 2 hours GTT due to elevated HGB A1C.   Patient is scheduled for 04/22/2024 @0820 .   Honore Lux CMA

## 2024-04-22 ENCOUNTER — Other Ambulatory Visit: Payer: Self-pay

## 2024-04-22 DIAGNOSIS — R7303 Prediabetes: Secondary | ICD-10-CM

## 2024-04-23 LAB — GLUCOSE TOLERANCE, 2 HOURS W/ 1HR
Glucose, 1 hour: 133 mg/dL (ref 70–179)
Glucose, 2 hour: 78 mg/dL (ref 70–152)
Glucose, Fasting: 87 mg/dL (ref 70–91)

## 2024-04-24 ENCOUNTER — Encounter: Payer: Self-pay | Admitting: Obstetrics and Gynecology

## 2024-05-06 ENCOUNTER — Telehealth: Payer: Self-pay

## 2024-05-06 DIAGNOSIS — Z3480 Encounter for supervision of other normal pregnancy, unspecified trimester: Secondary | ICD-10-CM | POA: Diagnosis not present

## 2024-05-06 DIAGNOSIS — Z3A11 11 weeks gestation of pregnancy: Secondary | ICD-10-CM

## 2024-05-06 DIAGNOSIS — Z3490 Encounter for supervision of normal pregnancy, unspecified, unspecified trimester: Secondary | ICD-10-CM | POA: Insufficient documentation

## 2024-05-06 NOTE — Progress Notes (Signed)
 New OB Intake  I connected with Andrea Mosley  on 05/06/24 at  1:15 PM EDT by MyChart Video Visit and verified that I am speaking with the correct person using two identifiers. Nurse is located at [facility] and pt is located at home.  I discussed the limitations, risks, security and privacy concerns of performing an evaluation and management service by telephone and the availability of in person appointments. I also discussed with the patient that there may be a patient responsible charge related to this service. The patient expressed understanding and agreed to proceed.  I explained I am completing New OB Intake today. We discussed EDD of 11/21/2024, by Last Menstrual Period. Pt is [id number]. I reviewed her allergies, medications and Medical/Surgical/OB history.    Patient Active Problem List   Diagnosis Date Noted   Supervision of other normal pregnancy, antepartum 05/06/2024   Prediabetes 03/28/2024   History of macrosomia in infant in prior pregnancy, currently pregnant in first trimester 03/28/2024   Obesity in pregnancy 03/28/2024     Concerns addressed today  Delivery Plans Plans to deliver at [facility]. Discussed the nature of our practice with multiple providers including residents and students. Due to the size of the practice, the delivering provider may not be the same as those providing prenatal care.   Patient is not interested in water birth.  MyChart/Babyscripts MyChart access verified. I explained pt will have some visits in office and some virtually. Babyscripts instructions given and order placed. Patient verifies receipt of registration text/e-mail. Account successfully created and app downloaded. If patient is a candidate for Optimized scheduling, add to sticky note.   Blood Pressure Cuff/Weight Scale Patient has private insurance; instructed to purchase blood pressure cuff and bring to first prenatal appt. Explained after first prenatal appt pt will check weekly  and document in Babyscripts. Patient does not have weight scale; patient may purchase if they desire to track weight weekly in Babyscripts.  Anatomy US  Explained first scheduled US  will be around 19 weeks. Anatomy US  scheduled for 07/08/2024 at 10:15AM.  Is patient a CenteringPregnancy candidate?  Declined Declined due to Schedule   Is patient a Mom+Baby Combined Care candidate?  Not a candidate   If accepted, confirm patient does not intend to move from the area for at least 12 months, then notify Mom+Baby staff  Is patient a candidate for Babyscripts Optimization? Yes, patient declined   First visit review I reviewed new OB appt with patient. Explained pt will be seen by Derick Fleeting at first visit. Discussed [place] genetic screening with patient. Yes Panorama and Horizon.. Routine prenatal labs is not   Last Pap Pap done at [facility] 08/07/2022   Clarence Croak, CMA 05/06/2024  1:27 PM

## 2024-05-14 ENCOUNTER — Other Ambulatory Visit: Payer: Self-pay

## 2024-05-14 ENCOUNTER — Other Ambulatory Visit (HOSPITAL_COMMUNITY)
Admission: RE | Admit: 2024-05-14 | Discharge: 2024-05-14 | Disposition: A | Discharge status: Home / Self Care | Source: Ambulatory Visit | Attending: Certified Nurse Midwife | Admitting: Certified Nurse Midwife

## 2024-05-14 ENCOUNTER — Ambulatory Visit: Payer: Self-pay | Admitting: Certified Nurse Midwife

## 2024-05-14 VITALS — BP 138/84 | HR 95 | Wt 341.0 lb

## 2024-05-14 DIAGNOSIS — Z1332 Encounter for screening for maternal depression: Secondary | ICD-10-CM | POA: Diagnosis not present

## 2024-05-14 DIAGNOSIS — Z3491 Encounter for supervision of normal pregnancy, unspecified, first trimester: Secondary | ICD-10-CM | POA: Insufficient documentation

## 2024-05-14 DIAGNOSIS — R7309 Other abnormal glucose: Secondary | ICD-10-CM | POA: Diagnosis not present

## 2024-05-14 DIAGNOSIS — Z3A12 12 weeks gestation of pregnancy: Secondary | ICD-10-CM | POA: Diagnosis not present

## 2024-05-14 DIAGNOSIS — Z31430 Encounter of female for testing for genetic disease carrier status for procreative management: Secondary | ICD-10-CM | POA: Diagnosis not present

## 2024-05-14 DIAGNOSIS — Z3481 Encounter for supervision of other normal pregnancy, first trimester: Secondary | ICD-10-CM

## 2024-05-15 LAB — ANEMIA PROFILE B
Basophils Absolute: 0 10*3/uL (ref 0.0–0.2)
Basos: 0 %
EOS (ABSOLUTE): 0.1 10*3/uL (ref 0.0–0.4)
Eos: 2 %
Ferritin: 60 ng/mL (ref 15–150)
Folate: 18.4 ng/mL (ref >3.0)
Hematocrit: 38.2 % (ref 34.0–46.6)
Hemoglobin: 13.2 g/dL (ref 11.1–15.9)
Immature Grans (Abs): 0 10*3/uL (ref 0.0–0.1)
Immature Granulocytes: 0 %
Iron Saturation: 21 % (ref 15–55)
Iron: 84 ug/dL (ref 27–159)
Lymphocytes Absolute: 1.6 10*3/uL (ref 0.7–3.1)
Lymphs: 22 %
MCH: 31.8 pg (ref 26.6–33.0)
MCHC: 34.6 g/dL (ref 31.5–35.7)
MCV: 92 fL (ref 79–97)
Monocytes Absolute: 0.6 10*3/uL (ref 0.1–0.9)
Monocytes: 8 %
Neutrophils Absolute: 4.9 10*3/uL (ref 1.4–7.0)
Neutrophils: 68 %
Platelets: 260 10*3/uL (ref 150–450)
RBC: 4.15 x10E6/uL (ref 3.77–5.28)
RDW: 13.2 % (ref 11.7–15.4)
Retic Ct Pct: 2.3 % (ref 0.6–2.6)
Total Iron Binding Capacity: 393 ug/dL (ref 250–450)
UIBC: 309 ug/dL (ref 131–425)
Vitamin B-12: 770 pg/mL (ref 232–1245)
WBC: 7.1 10*3/uL (ref 3.4–10.8)

## 2024-05-15 LAB — GC/CHLAMYDIA PROBE AMP (~~LOC~~) NOT AT ARMC
Chlamydia: NEGATIVE
Comment: NEGATIVE
Comment: NORMAL
Neisseria Gonorrhea: NEGATIVE

## 2024-05-15 LAB — VITAMIN D 25 HYDROXY (VIT D DEFICIENCY, FRACTURES): Vit D, 25-Hydroxy: 17.6 ng/mL — ABNORMAL LOW (ref 30.0–100.0)

## 2024-05-16 LAB — URINE CULTURE, OB REFLEX

## 2024-05-16 LAB — CULTURE, OB URINE

## 2024-05-17 ENCOUNTER — Ambulatory Visit: Payer: Self-pay | Admitting: Certified Nurse Midwife

## 2024-05-17 DIAGNOSIS — E559 Vitamin D deficiency, unspecified: Secondary | ICD-10-CM

## 2024-05-17 MED ORDER — VITAMIN D (ERGOCALCIFEROL) 1.25 MG (50000 UNIT) PO CAPS: 50000.0000 [IU] | ORAL_CAPSULE | ORAL | 0 refills | Status: DC

## 2024-05-17 NOTE — Progress Notes (Signed)
 History:    Andrea Mosley is a 33 y.o. [phone] at [redacted]w[redacted]d by LMP, early ultrasound being seen today for her first obstetrical visit.  Her obstetrical history is significant for three uncomplicated prior pregnancies with vaginal deliveries at term. Patient does intend to breast feed. Pregnancy history fully reviewed.  Patient reports no complaints.      HISTORY: OB History  Gravida Para Term Preterm AB Living  5 3 3  0 1 3  SAB IAB Ectopic Multiple Live Births  1 0 0 0 3    # Outcome Date GA Lbr Len/2nd Weight Sex Type Anes PTL Lv  5 Current           4 Term 12/26/15 [redacted]w[redacted]d  8 lb 7 oz (3.827 kg) F Vag-Spont   LIV  3 SAB 2016          2 Term 01/18/14 [redacted]w[redacted]d  9 lb 7 oz (4.281 kg) M Vag-Spont   LIV  1 Term 06/03/12 [redacted]w[redacted]d  8 lb 6 oz (3.799 kg) M Vag-Spont   LIV    Last pap smear was done 08/07/22 and was normal  No past medical history on file. Past Surgical History:  Procedure Laterality Date   OPEN REDUCTION SHOULDER DISLOCATION Right 10/30/2016   No family history on file. Social History   Tobacco Use   Smoking status: Never   Smokeless tobacco: Never  Vaping Use   Vaping status: Never Used  Substance Use Topics   Alcohol use: No   Drug use: No   Allergies  Allergen Reactions   Prednisone Swelling and Rash   Current Outpatient Medications on File Prior to Visit  Medication Sig Dispense Refill   Prenatal Vit-Fe Fumarate-FA (PRENATAL PLUS VITAMIN/MINERAL) 27-1 MG TABS Take 1 tablet by mouth daily. 30 tablet 11   amoxicillin -clavulanate (AUGMENTIN ) 875-125 MG tablet Take 1 tablet by mouth every 12 (twelve) hours. (Patient not taking: Reported on 03/28/2024) 14 tablet 0   chlorhexidine  (PERIDEX ) 0.12 % solution Use as directed 15 mLs in the mouth or throat 2 (two) times daily. (Patient not taking: Reported on 03/28/2024) 120 mL 0   ibuprofen  (ADVIL ) 600 MG tablet Take 1 tablet (600 mg total) by mouth every 6 (six) hours as needed. (Patient not taking: Reported on  03/28/2024) 30 tablet 0   No current facility-administered medications on file prior to visit.    Review of Systems Pertinent items noted in HPI and remainder of comprehensive ROS otherwise negative. Physical Exam:   Vitals:   05/14/24 1555  BP: 138/84  Pulse: 95  Weight: (!) 341 lb (154.7 kg)   Fetal Heart Rate (bpm): 161  Constitutional: Well-developed, well-nourished pregnant female in no acute distress.  HEENT: PERRLA Skin: normal color and turgor, no rash Cardiovascular: normal rate & rhythm, warm and well perfused Respiratory: normal effort, no problems with respiration noted GI: Abd soft, non-distended MS: Extremities nontender, no edema, normal ROM Neurologic: Alert and oriented x 4.  GU: no CVA tenderness Pelvic: Exam deferred  Assessment:     Pregnancy: [id number] Patient Active Problem List   Diagnosis Date Noted   Supervision of low-risk pregnancy 05/06/2024   Prediabetes 03/28/2024   History of macrosomia in infant in prior pregnancy, currently pregnant in first trimester 03/28/2024   Obesity in pregnancy 03/28/2024     Plan:  1. Encounter for supervision of low-risk pregnancy in first trimester (Primary) - Culture, OB Urine - PANORAMA PRENATAL TEST - Anemia Profile B - Vitamin D (25 hydroxy) -  OB RESULTS CONSOLE RPR - OB RESULTS CONSOLE HIV antibody - OB RESULTS CONSOLE Rubella Antibody - OB RESULTS CONSOLE Hepatitis B surface antigen - OB RESULTS CONSOLE ABO/Rh - OB RESULTS CONSOLE Antibody Screen - OB RESULTS CONSOLE Hemoglobin and hematocrit, blood - OB RESULTS CONSOLE PLATELET COUNT - Hepatitis C antibody - GC/Chlamydia probe amp ([place])not at [facility] - Urine Culture, OB Reflex  2. [redacted] weeks gestation of pregnancy - Routine OB care, will offer AFP at next visit  3. Encounter of female for testing for genetic disease carrier status for procreative management - HORIZON Basic Panel  4. Elevated hemoglobin A1c - Passed early 2hr GTT  5.  Initial obstetric visit in first trimester  - Initial labs reviewed, additional panels added - Continue prenatal vitamins. - Problem list reviewed and updated. - Genetic Screening discussed, First trimester screen, Quad screen, and NIPS: ordered. - Ultrasound discussed; fetal anatomic survey: ordered. - Anticipatory guidance about prenatal visits given including labs, ultrasounds, and testing. - Discussed usage of Babyscripts and virtual visits as additional source of managing and completing prenatal visits in midst of coronavirus and pandemic.   - Encouraged to complete MyChart Registration for her ability to review results, send requests, and have questions addressed.  - The nature of [facility]/Faculty Practice with multiple MDs and Advanced Practice Providers was explained to patient; also emphasized that residents, students are part of our team. - Routine obstetric precautions reviewed. Encouraged to seek out care at office or emergency room [facility] preferred) for urgent and/or emergent concerns.  Return in about 4 weeks (around 06/11/2024) for IN-PERSON, LOB.    Future Appointments  Date Time Provider Department Center  05/20/2024  2:35 PM [facility] US2 [facility]  06/11/2024 10:55 AM Cleophas [facility]  07/08/2024 10:00 AM [facility] PROVIDER 1 [facility]  07/08/2024 10:30 AM [facility] US3 [facility]    Lemuel Quaker, MSN, CNM, IBCLC Certified Nurse Midwife, [facility]

## 2024-05-20 ENCOUNTER — Other Ambulatory Visit

## 2024-05-21 LAB — PANORAMA PRENATAL TEST FULL PANEL:PANORAMA TEST PLUS 5 ADDITIONAL MICRODELETIONS: FETAL FRACTION: 6.9

## 2024-05-23 ENCOUNTER — Encounter: Payer: Self-pay | Admitting: Certified Nurse Midwife

## 2024-05-23 LAB — HORIZON CUSTOM: REPORT SUMMARY: NEGATIVE

## 2024-05-26 NOTE — Telephone Encounter (Signed)
 Attempted to call pt, VM box was full so unable to leave message for pt.

## 2024-05-27 ENCOUNTER — Ambulatory Visit

## 2024-05-27 ENCOUNTER — Other Ambulatory Visit: Payer: Self-pay

## 2024-05-27 DIAGNOSIS — Z3492 Encounter for supervision of normal pregnancy, unspecified, second trimester: Secondary | ICD-10-CM | POA: Diagnosis not present

## 2024-05-27 DIAGNOSIS — Z3A15 15 weeks gestation of pregnancy: Secondary | ICD-10-CM | POA: Diagnosis not present

## 2024-05-27 DIAGNOSIS — Z3490 Encounter for supervision of normal pregnancy, unspecified, unspecified trimester: Secondary | ICD-10-CM

## 2024-06-11 ENCOUNTER — Encounter: Payer: Self-pay | Admitting: Certified Nurse Midwife

## 2024-07-08 ENCOUNTER — Ambulatory Visit (HOSPITAL_BASED_OUTPATIENT_CLINIC_OR_DEPARTMENT_OTHER): Payer: Self-pay | Admitting: Obstetrics and Gynecology

## 2024-07-08 ENCOUNTER — Ambulatory Visit: Payer: Self-pay | Attending: Certified Nurse Midwife

## 2024-07-08 ENCOUNTER — Other Ambulatory Visit: Payer: Self-pay | Admitting: *Deleted

## 2024-07-08 VITALS — BP 124/86

## 2024-07-08 DIAGNOSIS — Z3492 Encounter for supervision of normal pregnancy, unspecified, second trimester: Secondary | ICD-10-CM | POA: Diagnosis present

## 2024-07-08 DIAGNOSIS — Z3A20 20 weeks gestation of pregnancy: Secondary | ICD-10-CM | POA: Diagnosis not present

## 2024-07-08 DIAGNOSIS — O99212 Obesity complicating pregnancy, second trimester: Secondary | ICD-10-CM

## 2024-07-08 DIAGNOSIS — O09299 Supervision of pregnancy with other poor reproductive or obstetric history, unspecified trimester: Secondary | ICD-10-CM

## 2024-07-08 DIAGNOSIS — Z3480 Encounter for supervision of other normal pregnancy, unspecified trimester: Secondary | ICD-10-CM | POA: Insufficient documentation

## 2024-07-08 DIAGNOSIS — E6689 Other obesity not elsewhere classified: Secondary | ICD-10-CM | POA: Diagnosis not present

## 2024-07-08 DIAGNOSIS — E66813 Obesity, class 3: Secondary | ICD-10-CM

## 2024-07-08 NOTE — Progress Notes (Signed)
 Maternal-Fetal Medicine Consultation Name: Andrea Mosley MRN: [id number]  G5 P3013 at 20w 4d gestation.  Patient is here for fetal anatomy scan. On cell-free fetal Linea screening, the risks of fetal aneuploidies are not increased.  Pregravid BMI 51.  Patient does not have hypertension or diabetes.  Obstetric history significant for 3 term vaginal deliveries.  All the newborns weighed over 8 pounds at birth.  Her pregnancies were not complicated by gestational diabetes or gestational hypertension/preeclampsia.  Ultrasound We performed a fetal anatomical survey.  Amniotic fluid is normal good fetal activity seen.  Fetal biometry is consistent with the previously established dates.  No markers of aneuploidies or obvious fetal structural defects are seen.  Pregravid BMI 51 Grade 3 obesity is independently associated with increased risk of stillbirth (2.5- to 3-fold), but the absolute risk is very small.  I discussed protocol of weekly antenatal testing from [redacted] weeks gestation until delivery. Maternal obesity imposes limitations on the resolution of images and fetal anomalies may be missed.  Low-dose aspirin prophylaxis Patient has moderate risk for gestational hypertension/preeclampsia.  I discussed the benefit of low-dose aspirin prophylaxis that delays or prevents preeclampsia.  I recommended that she take aspirin 81 mg from today until delivery. Patient will discuss with her provider. Recommendations - Appointments were made for her to return in 4 weeks in 8 weeks for fetal growth assessment. - Aspirin 81 mg daily till delivery.  Consultation including face-to-face (more than 50%) counseling 30 minutes.

## 2024-07-23 ENCOUNTER — Ambulatory Visit: Payer: Self-pay | Admitting: Certified Nurse Midwife

## 2024-07-23 ENCOUNTER — Other Ambulatory Visit: Payer: Self-pay

## 2024-07-23 VITALS — BP 127/86 | HR 102 | Wt 359.9 lb

## 2024-07-23 DIAGNOSIS — Z3A22 22 weeks gestation of pregnancy: Secondary | ICD-10-CM | POA: Diagnosis not present

## 2024-07-23 DIAGNOSIS — Z3492 Encounter for supervision of normal pregnancy, unspecified, second trimester: Secondary | ICD-10-CM

## 2024-07-23 NOTE — Progress Notes (Signed)
   PRENATAL VISIT NOTE  Subjective:  Andrea Mosley is a 33 y.o. [phone] at [redacted]w[redacted]d being seen today for ongoing prenatal care.  She is currently monitored for the following issues for this low-risk pregnancy and has Prediabetes; History of macrosomia in infant in prior pregnancy, currently pregnant in first trimester; Obesity in pregnancy; and Supervision of low-risk pregnancy on their problem list.  Patient reports mild pedal/ankle swelling after a long shift, otherwise well.  Contractions: Not present. Vag. Bleeding: None.  Movement: Present. Denies leaking of fluid.   The following portions of the patient's history were reviewed and updated as appropriate: allergies, current medications, past family history, past medical history, past social history, past surgical history and problem list.   Objective:   Vitals:   07/23/24 1451  BP: 127/86  Pulse: (!) 102  Weight: (!) 359 lb 14.4 oz (163.2 kg)   Fetal Status:  Fetal Heart Rate (bpm): 156   Movement: Present    General: Alert, oriented and cooperative. Patient is in no acute distress.  Skin: Skin is warm and dry. No rash noted.   Cardiovascular: Normal heart rate noted  Respiratory: Normal respiratory effort, no problems with respiration noted  Abdomen: Soft, gravid, appropriate for gestational age.  Pain/Pressure: Absent     Pelvic: Cervical exam deferred        Extremities: Normal range of motion.  Edema: Trace  Mental Status: Normal mood and affect. Normal behavior. Normal judgment and thought content.   Assessment and Plan:  Pregnancy: [id number] at [redacted]w[redacted]d 1. Encounter for supervision of low-risk pregnancy in second trimester (Primary) - Doing well, starting to feel regular movement  2. [redacted] weeks gestation of pregnancy - Routine OB care  - Encouraged use of compression socks to prevent swelling while on her feet at work  Preterm labor symptoms and general obstetric precautions including but not limited to vaginal  bleeding, contractions, leaking of fluid and fetal movement were reviewed in detail with the patient. Please refer to After Visit Summary for other counseling recommendations.   No follow-ups on file.  Future Appointments  Date Time Provider Department Center  08/05/2024  2:45 PM [facility] PROVIDER 1 [facility]  08/05/2024  3:00 PM [facility] US4 [facility]  08/20/2024  3:15 PM Vannie Cornell JONELLE EDDY [facility]  09/02/2024  9:15 AM [facility] PROVIDER 1 [facility]  09/02/2024  9:30 AM [facility] US5 [facility]    Cornell JONELLE Vannie, CNM

## 2024-08-05 ENCOUNTER — Ambulatory Visit

## 2024-08-16 ENCOUNTER — Other Ambulatory Visit: Payer: Self-pay | Admitting: Certified Nurse Midwife

## 2024-08-16 DIAGNOSIS — E559 Vitamin D deficiency, unspecified: Secondary | ICD-10-CM

## 2024-08-20 ENCOUNTER — Ambulatory Visit: Admitting: Certified Nurse Midwife

## 2024-08-20 ENCOUNTER — Other Ambulatory Visit: Payer: Self-pay

## 2024-08-20 VITALS — Wt 366.0 lb

## 2024-08-20 DIAGNOSIS — O09292 Supervision of pregnancy with other poor reproductive or obstetric history, second trimester: Secondary | ICD-10-CM | POA: Diagnosis not present

## 2024-08-20 DIAGNOSIS — Z3A26 26 weeks gestation of pregnancy: Secondary | ICD-10-CM | POA: Diagnosis not present

## 2024-08-20 DIAGNOSIS — O09291 Supervision of pregnancy with other poor reproductive or obstetric history, first trimester: Secondary | ICD-10-CM

## 2024-08-20 DIAGNOSIS — R7303 Prediabetes: Secondary | ICD-10-CM | POA: Diagnosis not present

## 2024-08-20 DIAGNOSIS — O99210 Obesity complicating pregnancy, unspecified trimester: Secondary | ICD-10-CM

## 2024-08-20 DIAGNOSIS — O99212 Obesity complicating pregnancy, second trimester: Secondary | ICD-10-CM | POA: Diagnosis not present

## 2024-08-20 DIAGNOSIS — Z3490 Encounter for supervision of normal pregnancy, unspecified, unspecified trimester: Secondary | ICD-10-CM

## 2024-08-20 NOTE — Progress Notes (Addendum)
   PRENATAL VISIT NOTE  Subjective:  Andrea Mosley is a 33 y.o. [phone] at [redacted]w[redacted]d being seen today for ongoing prenatal care.  She is currently monitored for the following issues for this low-risk pregnancy and has Prediabetes; History of macrosomia in infant in prior pregnancy, currently pregnant in first trimester; Obesity in pregnancy; and Supervision of low-risk pregnancy on their problem list.  Patient reports no complaints.  Contractions: Not present. Vag. Bleeding: None.  Movement: Present. Denies leaking of fluid.   The following portions of the patient's history were reviewed and updated as appropriate: allergies, current medications, past family history, past medical history, past social history, past surgical history and problem list.   Objective:    Vitals:   08/20/24 1543  Weight: (!) 366 lb (166 kg)    Fetal Status:  Fetal Heart Rate (bpm): 134 Fundal Height: 27 cm Movement: Present    General: Alert, oriented and cooperative. Patient is in no acute distress.  Skin: Skin is warm and dry. No rash noted.   Cardiovascular: Normal heart rate noted  Respiratory: Normal respiratory effort, no problems with respiration noted  Abdomen: Soft, gravid, appropriate for gestational age.  Pain/Pressure: Present (pelvic pressure)     Pelvic: Cervical exam deferred        Extremities: Normal range of motion.  Edema: Trace (BLE)  Mental Status: Normal mood and affect. Normal behavior. Normal judgment and thought content.   Assessment and Plan:  Pregnancy: [id number] at [redacted]w[redacted]d 1. Encounter for supervision of low-risk pregnancy, antepartum (Primary) - Doing well, feeling regular and vigorous fetal movement   2. [redacted] weeks gestation of pregnancy - Routine OB care  - CBC; Future - RPR; Future - HIV Antibody (routine testing w rflx); Future - Glucose Tolerance, 2 Hours w/1 Hour; Future  3. Prediabetes -Elevated A1C 03/29/23 -Early GTT - Passed - GTT next visit  4. History of  macrosomia in infant in prior pregnancy, currently pregnant in first trimester  5. Obesity in pregnancy Follow up us   09/02/24  Preterm labor symptoms and general obstetric precautions including but not limited to vaginal bleeding, contractions, leaking of fluid and fetal movement were reviewed in detail with the patient. Please refer to After Visit Summary for other counseling recommendations.   Return in about 2 weeks (around 09/03/2024) for LOB.  Future Appointments  Date Time Provider Department Center  09/02/2024  9:15 AM [facility] PROVIDER 1 [facility]  09/02/2024  9:30 AM [facility] US5 [facility]  09/03/2024  8:40 AM [facility] LAB [facility]  09/03/2024  8:55 AM Vannie Cornell SAUNDERS, CNM [facility]  09/17/2024 11:15 AM Vannie Cornell SAUNDERS, CNM [facility]  10/01/2024  8:15 AM Vannie Cornell SAUNDERS, CNM [facility]  10/15/2024  2:35 PM Vannie Cornell SAUNDERS EDDY [facility]  10/29/2024  3:55 PM [facility]-GENERAL 2 [facility]  11/05/2024  2:35 PM Vannie Cornell SAUNDERS, CNM [facility]  11/12/2024  1:55 PM Vannie Cornell SAUNDERS, CNM [facility]  11/19/2024  2:35 PM Vannie Cornell SAUNDERS EDDY [facility]  11/26/2024  1:15 PM Vannie Cornell SAUNDERS, CNM [facility]    Derrek JINNY Freund, NP Student

## 2024-08-20 NOTE — Patient Instructions (Signed)

## 2024-09-02 ENCOUNTER — Ambulatory Visit: Attending: Obstetrics and Gynecology | Admitting: Obstetrics and Gynecology

## 2024-09-02 ENCOUNTER — Ambulatory Visit

## 2024-09-02 ENCOUNTER — Other Ambulatory Visit

## 2024-09-02 ENCOUNTER — Other Ambulatory Visit: Payer: Self-pay | Admitting: *Deleted

## 2024-09-02 VITALS — BP 128/79

## 2024-09-02 DIAGNOSIS — E669 Obesity, unspecified: Secondary | ICD-10-CM

## 2024-09-02 DIAGNOSIS — O09293 Supervision of pregnancy with other poor reproductive or obstetric history, third trimester: Secondary | ICD-10-CM

## 2024-09-02 DIAGNOSIS — O403XX0 Polyhydramnios, third trimester, not applicable or unspecified: Secondary | ICD-10-CM

## 2024-09-02 DIAGNOSIS — O99213 Obesity complicating pregnancy, third trimester: Secondary | ICD-10-CM

## 2024-09-02 DIAGNOSIS — O358XX0 Maternal care for other (suspected) fetal abnormality and damage, not applicable or unspecified: Secondary | ICD-10-CM

## 2024-09-02 DIAGNOSIS — O99212 Obesity complicating pregnancy, second trimester: Secondary | ICD-10-CM

## 2024-09-02 DIAGNOSIS — E66813 Obesity, class 3: Secondary | ICD-10-CM

## 2024-09-02 DIAGNOSIS — Z362 Encounter for other antenatal screening follow-up: Secondary | ICD-10-CM | POA: Diagnosis not present

## 2024-09-02 DIAGNOSIS — Z3A28 28 weeks gestation of pregnancy: Secondary | ICD-10-CM | POA: Diagnosis not present

## 2024-09-02 DIAGNOSIS — E6689 Obesity complicating pregnancy, third trimester: Secondary | ICD-10-CM

## 2024-09-02 DIAGNOSIS — Z3493 Encounter for supervision of normal pregnancy, unspecified, third trimester: Secondary | ICD-10-CM

## 2024-09-02 NOTE — Progress Notes (Signed)
 Maternal-Fetal Medicine Consultation  Name: Andrea Mosley  MRN: [id number]  GA: [id number] [redacted]w[redacted]d  Patient returned for fetal growth assessment. Pregravid BMI 51.  Obstetric history is significant for 3 term vaginal deliveries.  None of her pregnancies were complicated by gestational diabetes.  Ultrasound Fetal growth is appropriate for gestational age.  Mild polyhydramnios is seen.  Cephalic presentation.  Detailed fetal anatomical survey was not performed.  Polyhydramnios I explained the finding of polyhydramnios and the possible causes including diabetes.  Patient will be screening for diabetes tomorrow.  In the absence of diabetes, polyhydramnios is usually idiopathic (no known cause) and is associated with good pregnancy outcomes.  I encouraged her to screen for gestational diabetes tomorrow. I counseled the patient again on the importance of weekly antenatal testing from [redacted] weeks gestation because of grade 3 obesity.   Recommendations -An appointment was made for her to return in 4 weeks for fetal growth and amniotic fluid assessments. - Weekly antenatal testing from [redacted] weeks gestation until delivery.     Consultation including face-to-face (more than 50%) counseling 20 minutes.

## 2024-09-03 ENCOUNTER — Other Ambulatory Visit: Payer: Self-pay

## 2024-09-03 ENCOUNTER — Encounter: Payer: Self-pay | Admitting: Certified Nurse Midwife

## 2024-09-03 ENCOUNTER — Ambulatory Visit (INDEPENDENT_AMBULATORY_CARE_PROVIDER_SITE_OTHER): Admitting: Certified Nurse Midwife

## 2024-09-03 VITALS — BP 123/85 | HR 115 | Wt 363.3 lb

## 2024-09-03 DIAGNOSIS — R7303 Prediabetes: Secondary | ICD-10-CM | POA: Diagnosis not present

## 2024-09-03 DIAGNOSIS — Z3493 Encounter for supervision of normal pregnancy, unspecified, third trimester: Secondary | ICD-10-CM | POA: Diagnosis not present

## 2024-09-03 DIAGNOSIS — Z3A26 26 weeks gestation of pregnancy: Secondary | ICD-10-CM

## 2024-09-03 DIAGNOSIS — Z3A28 28 weeks gestation of pregnancy: Secondary | ICD-10-CM

## 2024-09-03 NOTE — Progress Notes (Signed)
   PRENATAL VISIT NOTE  Subjective:  Hillari Zumwalt is a 33 y.o. [phone] at [redacted]w[redacted]d being seen today for ongoing prenatal care.  She is currently monitored for the following issues for this low-risk pregnancy and has Prediabetes; History of macrosomia in infant in prior pregnancy, currently pregnant in first trimester; Obesity in pregnancy; and Supervision of low-risk pregnancy on their problem list.  Patient reports cold symptoms for the last couple of days, no fever.  Contractions: Not present. Vag. Bleeding: None.  Movement: Present. Denies leaking of fluid.   The following portions of the patient's history were reviewed and updated as appropriate: allergies, current medications, past family history, past medical history, past social history, past surgical history and problem list.   Objective:   Vitals:   09/03/24 0926  BP: 123/85  Pulse: (!) 115  Weight: (!) 363 lb 4.8 oz (164.8 kg)   Fetal Status:  Fetal Heart Rate (bpm): 145 Fundal Height: 28 cm Movement: Present    General: Alert, oriented and cooperative. Patient is in no acute distress.  Skin: Skin is warm and dry. No rash noted.   Cardiovascular: Normal heart rate noted  Respiratory: Normal respiratory effort, no problems with respiration noted  Abdomen: Soft, gravid, appropriate for gestational age.  Pain/Pressure: Absent     Pelvic: Cervical exam deferred        Extremities: Normal range of motion.  Edema: None  Mental Status: Normal mood and affect. Normal behavior. Normal judgment and thought content.   Assessment and Plan:  Pregnancy: [id number] at [redacted]w[redacted]d 1. Encounter for supervision of low-risk pregnancy in third trimester (Primary) - Doing well, feeling regular and vigorous fetal movement  - Safe med list given  2. [redacted] weeks gestation of pregnancy - Routine OB care including GTT today  3. Prediabetes - Passed early GTT, repeating today  Preterm labor symptoms and general obstetric precautions including but  not limited to vaginal bleeding, contractions, leaking of fluid and fetal movement were reviewed in detail with the patient. Please refer to After Visit Summary for other counseling recommendations.   Return in about 2 weeks (around 09/17/2024) for IN-PERSON, HOB.  Future Appointments  Date Time Provider Department Center  09/17/2024 11:15 AM Vannie Cornell JONELLE EDDY [facility]  10/01/2024  8:15 AM Vannie Cornell JONELLE, CNM [facility]  10/01/2024 11:15 AM [facility] PROVIDER 1 [facility]  10/01/2024 11:30 AM [facility] US5 [facility]  10/14/2024  8:15 AM [facility] PROVIDER 1 [facility]  10/14/2024  8:30 AM [facility] US2 [facility]  10/15/2024  2:35 PM Vannie Cornell JONELLE EDDY [facility]  10/22/2024  2:15 PM [facility] PROVIDER 1 [facility]  10/22/2024  2:30 PM [facility] US3 [facility]  10/28/2024  9:15 AM [facility] PROVIDER 1 [facility]  10/28/2024  9:30 AM [facility] US1 [facility]  10/29/2024  3:55 PM [facility]-GENERAL 2 [facility]  11/05/2024  2:35 PM Vannie Cornell JONELLE EDDY [facility]  11/12/2024  1:55 PM Vannie Cornell JONELLE EDDY [facility]  11/19/2024  2:35 PM Vannie Cornell JONELLE EDDY [facility]  11/26/2024  1:15 PM Vannie Cornell JONELLE, CNM [facility]    Cornell JONELLE Vannie, CNM

## 2024-09-03 NOTE — Patient Instructions (Signed)

## 2024-09-04 ENCOUNTER — Ambulatory Visit: Payer: Self-pay | Admitting: Certified Nurse Midwife

## 2024-09-04 LAB — CBC
Hematocrit: 36.3 % (ref 34.0–46.6)
Hemoglobin: 12.3 g/dL (ref 11.1–15.9)
MCH: 32.3 pg (ref 26.6–33.0)
MCHC: 33.9 g/dL (ref 31.5–35.7)
MCV: 95 fL (ref 79–97)
Platelets: 270 x10E3/uL (ref 150–450)
RBC: 3.81 x10E6/uL (ref 3.77–5.28)
RDW: 12.5 % (ref 11.7–15.4)
WBC: 7.7 x10E3/uL (ref 3.4–10.8)

## 2024-09-04 LAB — GLUCOSE TOLERANCE, 2 HOURS W/ 1HR
Glucose, 1 hour: 163 mg/dL (ref 70–179)
Glucose, 2 hour: 85 mg/dL (ref 70–152)
Glucose, Fasting: 76 mg/dL (ref 70–91)

## 2024-09-04 LAB — HIV ANTIBODY (ROUTINE TESTING W REFLEX): HIV Screen 4th Generation wRfx: NONREACTIVE

## 2024-09-04 LAB — RPR: RPR Ser Ql: NONREACTIVE

## 2024-09-17 ENCOUNTER — Encounter: Admitting: Certified Nurse Midwife

## 2024-09-25 ENCOUNTER — Encounter: Payer: Self-pay | Admitting: Family Medicine

## 2024-09-25 ENCOUNTER — Encounter: Payer: Self-pay | Admitting: *Deleted

## 2024-10-01 ENCOUNTER — Ambulatory Visit

## 2024-10-01 ENCOUNTER — Encounter: Admitting: Certified Nurse Midwife

## 2024-10-01 ENCOUNTER — Telehealth (INDEPENDENT_AMBULATORY_CARE_PROVIDER_SITE_OTHER): Admitting: Certified Nurse Midwife

## 2024-10-01 ENCOUNTER — Ambulatory Visit: Attending: Obstetrics and Gynecology | Admitting: Obstetrics and Gynecology

## 2024-10-01 VITALS — BP 130/87

## 2024-10-01 DIAGNOSIS — Z3A32 32 weeks gestation of pregnancy: Secondary | ICD-10-CM | POA: Insufficient documentation

## 2024-10-01 DIAGNOSIS — O99213 Obesity complicating pregnancy, third trimester: Secondary | ICD-10-CM

## 2024-10-01 DIAGNOSIS — E669 Obesity, unspecified: Secondary | ICD-10-CM | POA: Diagnosis not present

## 2024-10-01 DIAGNOSIS — O09293 Supervision of pregnancy with other poor reproductive or obstetric history, third trimester: Secondary | ICD-10-CM

## 2024-10-01 DIAGNOSIS — Z364 Encounter for antenatal screening for fetal growth retardation: Secondary | ICD-10-CM | POA: Insufficient documentation

## 2024-10-01 DIAGNOSIS — O403XX0 Polyhydramnios, third trimester, not applicable or unspecified: Secondary | ICD-10-CM

## 2024-10-01 DIAGNOSIS — O358XX0 Maternal care for other (suspected) fetal abnormality and damage, not applicable or unspecified: Secondary | ICD-10-CM | POA: Diagnosis not present

## 2024-10-01 DIAGNOSIS — Z3493 Encounter for supervision of normal pregnancy, unspecified, third trimester: Secondary | ICD-10-CM

## 2024-10-01 DIAGNOSIS — O09893 Supervision of other high risk pregnancies, third trimester: Secondary | ICD-10-CM | POA: Diagnosis not present

## 2024-10-01 NOTE — Progress Notes (Signed)
 After review, MFM consult with provider is not indicated for today  Arna Ranks, MD 10/01/2024 3:27 PM  [facility]

## 2024-10-01 NOTE — Progress Notes (Signed)
 OBSTETRICS PRENATAL VIRTUAL VISIT ENCOUNTER NOTE  Provider location: [facility] for [facility]   Patient location: Home  I connected with Andrea Mosley on 10/01/24 at  8:15 AM EDT by MyChart Video Encounter and verified that I am speaking with the correct person using two identifiers. I discussed the limitations, risks, security and privacy concerns of performing an evaluation and management service virtually and the availability of in person appointments. I also discussed with the patient that there may be a patient responsible charge related to this service. The patient expressed understanding and agreed to proceed. Subjective:  Andrea Mosley is a 33 y.o. [phone] at [redacted]w[redacted]d being seen today for ongoing prenatal care.  She is currently monitored for the following issues for this low-risk pregnancy and has Prediabetes; Obesity in pregnancy; Supervision of low-risk pregnancy; Polyhydramnios affecting pregnancy in third trimester; and History of macrosomia in infant in prior pregnancy, currently pregnant, third trimester on their problem list.  Patient reports no complaints.  Contractions: Not present. Vag. Bleeding: None.  Movement: Present. Denies any leaking of fluid.   The following portions of the patient's history were reviewed and updated as appropriate: allergies, current medications, past family history, past medical history, past social history, past surgical history and problem list.   Objective:    There were no vitals filed for this visit.  Fetal Status:      Movement: Present    General: Alert, oriented and cooperative. Patient is in no acute distress.  Respiratory: Normal respiratory effort, no problems with respiration noted  Mental Status: Normal mood and affect. Normal behavior. Normal judgment and thought content.  Rest of physical exam deferred due to type of encounter  Imaging: US  MFM OB FOLLOW UP Result Date:  09/02/2024 ----------------------------------------------------------------------  OBSTETRICS REPORT                       (Signed Final 09/02/2024 09:37 am) ---------------------------------------------------------------------- Patient Info  ID #:       [id number]                          D.O.B.:  11-May-1991 (33 yrs)(F)  Name:       Andrea Mosley           Visit Date: 09/02/2024 07:18 am ---------------------------------------------------------------------- Performed By  Attending:        Fredia Fresh MD        Ref. Address:     [address]                                                             [place], [place]                                                             [address]  Performed By:     Rumaldo Sharps RDMS      [facility]  Referred By:      [facility] ---------------------------------------------------------------------- Orders  #  Description                           Code        Ordered By  1  US  MFM OB FOLLOW UP                   [id number]    FREDIA FRESH ----------------------------------------------------------------------  #  Order #                     Accession #                Episode #  1  [id number]                   [id number]                 [id number] ---------------------------------------------------------------------- Indications  Polyhydramnios, third trimester, antepartum    O40.3XX0  condition or complication, singleton fetus  Obesity complicating pregnancy, third          O99.213  trimester (pregravid BMI 51)  Poor obstetric history: Prior fetal macrosomia O09.299  Low risk NIPS, neg Horizon  Encounter for other antenatal screening        Z36.2  follow-up  [redacted] weeks gestation of pregnancy                Z3A.28  ---------------------------------------------------------------------- Vital Signs  BP:          128/79 ---------------------------------------------------------------------- Fetal Evaluation  Num Of Fetuses:         1  Fetal Heart Rate(bpm):  154  Cardiac Activity:       Observed  Presentation:           Cephalic  Placenta:               Anterior  P. Cord Insertion:      Visualized  Amniotic Fluid  AFI FV:      Polyhydramnios  AFI Sum(cm)     %Tile       Largest Pocket(cm)  29.71           > 97        5.63  RUQ(cm)       RLQ(cm)       LUQ(cm)        LLQ(cm)  5.69          6.9           9.19  7.93 ---------------------------------------------------------------------- Biometry  BPD:      73.5  mm     G. Age:  29w 3d         68  %    CI:        76.07   %    70 - 86                                                          FL/HC:      20.4   %    19.6 - 20.8  HC:      267.1  mm     G. Age:  29w 1d         33  %    HC/AC:      1.06        0.99 - 1.21  AC:      250.9  mm     G. Age:  29w 2d         66  %    FL/BPD:     74.0   %    71 - 87  FL:       54.4  mm     G. Age:  28w 5d         40  %    FL/AC:      21.7   %    20 - 24  LV:          4  mm  Est. FW:    1338  gm    2 lb 15 oz      58  % ---------------------------------------------------------------------- OB History  Gravidity:    5         Term:   3        Prem:   0        SAB:   1  TOP:          0       Ectopic:  0        Living: 3 ---------------------------------------------------------------------- Gestational Age  LMP:           28w 4d        Date:  02/15/24                  EDD:   11/21/24  U/S Today:     29w 1d                                        EDD:   11/17/24  Best:          28w 4d     Det. By:  LMP  (02/15/24)          EDD:   11/21/24 ---------------------------------------------------------------------- Anatomy  Cranium:               Previously seen        Aortic Arch:            Previously seen  Cavum:                 Previously seen         Ductal Arch:  Previously seen  Ventricles:            Appears normal         Diaphragm:              Appears normal  Choroid Plexus:        Previously seen        Stomach:                Appears normal, left                                                                        sided  Cerebellum:            Previously seen        Abdomen:                Previously seen  Posterior Fossa:       Previously seen        Abdominal Wall:         Previously seen  Face:                  Orbits and profile     Cord Vessels:           Previously seen                         previously seen  Lips:                  Previously seen        Kidneys:                Appear normal  Thoracic:              Previously seen        Bladder:                Appears normal  Heart:                 Appears normal         Spine:                  Previously seen                         (4CH, axis, and                         situs)  RVOT:                  Previously seen        Upper Extremities:      Previously seen  LVOT:                  Previously seen        Lower Extremities:      Previously seen  Other:  Feet are normal in appearance. Spine curvature/shape remains          limited due to fetal position. ---------------------------------------------------------------------- Cervix Uterus Adnexa  Cervix  Not visualized (advanced GA >24wks)  Uterus  No abnormality visualized.  Right Ovary  Not visualized.  Left  Ovary  Not visualized.  Cul De Sac  No free fluid seen.  Adnexa  No abnormality visualized. ---------------------------------------------------------------------- Impression  Patient returned for fetal growth assessment.  Pregravid BMI 51.  Obstetric history is significant for 3 term vaginal deliveries.  None of her pregnancies were complicated by gestational  diabetes.  Ultrasound  Fetal growth is appropriate for gestational age.  Mild  polyhydramnios is seen.  Cephalic presentation.  Detailed  fetal anatomical survey was  not performed.  Polyhydramnios  I explained the finding of polyhydramnios and the possible  causes including diabetes.  Patient will be screening for  diabetes tomorrow.  In the absence of diabetes,  polyhydramnios is usually idiopathic (no known cause) and is  associated with good pregnancy outcomes.  I encouraged her to screen for gestational diabetes tomorrow.  I counseled the patient again on the importance of weekly  antenatal testing from [redacted] weeks gestation because of grade 3  obesity. ---------------------------------------------------------------------- Recommendations  -An appointment was made for her to return in 4 weeks for  fetal growth and amniotic fluid assessments.  - Weekly antenatal testing from [redacted] weeks gestation until  delivery. ----------------------------------------------------------------------                 Fredia Fresh, MD Electronically Signed Final Report   09/02/2024 09:37 am ----------------------------------------------------------------------    Assessment and Plan:  Pregnancy: [id number] at [redacted]w[redacted]d 1. Encounter for supervision of low-risk pregnancy in third trimester (Primary) - Doing well, feeling regular and vigorous fetal movement   2. [redacted] weeks gestation of pregnancy - Routine OB care   3. Polyhydramnios affecting pregnancy in third trimester - Had elevated A1C at beginning of pregnancy but has passed two GTTs - Has repeat U/S at MFM today.  Preterm labor symptoms and general obstetric precautions including but not limited to vaginal bleeding, contractions, leaking of fluid and fetal movement were reviewed in detail with the patient. I discussed the assessment and treatment plan with the patient. The patient was provided an opportunity to ask questions and all were answered. The patient agreed with the plan and demonstrated an understanding of the instructions. The patient was advised to call back or seek an in-person office evaluation/go to [facility] at [facility] for any urgent or concerning symptoms. Please refer to After Visit Summary for other counseling recommendations.   I provided 10 minutes of face-to-face time during this encounter.  Return in about 2 weeks (around 10/15/2024) for IN-PERSON, LOB.  Future Appointments  Date Time Provider Department Center  10/01/2024 11:15 AM [facility] PROVIDER 1 [facility]  10/01/2024 11:30 AM [facility] US5 [facility]  10/14/2024  8:15 AM [facility] PROVIDER 1 [facility]  10/14/2024  8:30 AM [facility] US2 [facility]  10/15/2024  2:35 PM Vannie Cornell JONELLE EDDY [facility]  10/22/2024  2:15 PM [facility] PROVIDER 1 [facility]  10/22/2024  2:30 PM [facility] US3 [facility]  10/28/2024  9:15 AM [facility] PROVIDER 1 [facility]  10/28/2024  9:30 AM [facility] US1 [facility]  10/29/2024  3:55 PM Delores Nidia CROME, FNP [facility]  11/05/2024  2:35 PM Vannie Cornell JONELLE, CNM [facility]  11/12/2024  1:55 PM Vannie Cornell JONELLE, CNM [facility]  11/19/2024  2:35 PM Vannie Cornell JONELLE, CNM [facility]  11/26/2024  1:15 PM Vannie Cornell JONELLE, CNM [facility]   Cornell JONELLE Vannie, CNM Center for [employer]

## 2024-10-03 ENCOUNTER — Encounter: Admitting: Family Medicine

## 2024-10-14 ENCOUNTER — Ambulatory Visit

## 2024-10-14 DIAGNOSIS — Z3493 Encounter for supervision of normal pregnancy, unspecified, third trimester: Secondary | ICD-10-CM

## 2024-10-15 ENCOUNTER — Other Ambulatory Visit: Payer: Self-pay

## 2024-10-15 ENCOUNTER — Ambulatory Visit (INDEPENDENT_AMBULATORY_CARE_PROVIDER_SITE_OTHER): Admitting: Certified Nurse Midwife

## 2024-10-15 VITALS — BP 122/75 | HR 114 | Wt 370.6 lb

## 2024-10-15 DIAGNOSIS — Z3493 Encounter for supervision of normal pregnancy, unspecified, third trimester: Secondary | ICD-10-CM

## 2024-10-15 DIAGNOSIS — O163 Unspecified maternal hypertension, third trimester: Secondary | ICD-10-CM

## 2024-10-15 DIAGNOSIS — R7309 Other abnormal glucose: Secondary | ICD-10-CM

## 2024-10-15 DIAGNOSIS — Z23 Encounter for immunization: Secondary | ICD-10-CM | POA: Diagnosis not present

## 2024-10-15 DIAGNOSIS — Z3A34 34 weeks gestation of pregnancy: Secondary | ICD-10-CM | POA: Diagnosis not present

## 2024-10-15 NOTE — Progress Notes (Addendum)
   PRENATAL VISIT NOTE  Subjective:  Andrea Mosley is a 33 y.o. [phone] at [redacted]w[redacted]d being seen today for ongoing prenatal care.  She is currently monitored for the following issues for this low-risk pregnancy and has Prediabetes; Obesity in pregnancy; Supervision of low-risk pregnancy; Polyhydramnios affecting pregnancy in third trimester; and History of macrosomia in infant in prior pregnancy, currently pregnant, third trimester on their problem list.  Patient reports no complaints.   . Vag. Bleeding: None.  Movement: Present. Denies leaking of fluid.   The following portions of the patient's history were reviewed and updated as appropriate: allergies, current medications, past family history, past medical history, past social history, past surgical history and problem list.   Objective:   Vitals:   10/15/24 0830  BP: (!) 141/88  Pulse: (!) 112  Weight: (!) 370 lb 9.6 oz (168.1 kg)   Fetal Status:  Fetal Heart Rate (bpm): 152 Fundal Height: 35 cm Movement: Present    General: Alert, oriented and cooperative. Patient is in no acute distress.  Skin: Skin is warm and dry. No rash noted.   Cardiovascular: Normal heart rate noted  Respiratory: Normal respiratory effort, no problems with respiration noted  Abdomen: Soft, gravid, appropriate for gestational age.  Pain/Pressure: Absent     Pelvic: Cervical exam deferred        Extremities: Normal range of motion.  Edema: Trace  Mental Status: Normal mood and affect. Normal behavior. Normal judgment and thought content.   Assessment and Plan:  Pregnancy: [id number] at [redacted]w[redacted]d 1. Encounter for supervision of low-risk pregnancy in third trimester (Primary) - Doing well, feeling regular and vigorous fetal movement  - Received Tdap vaccine today 2. [redacted] weeks gestation of pregnancy - Routine OB care   3. Elevated blood pressure affecting pregnancy in third trimester, antepartum - Patient BP elevated today. Will check Pre-E labs today -  CBC - Comp Met (CMET) - Protein / creatinine ratio, urine  4. Elevated hemoglobin A1c - Patient requesting to be checked again today - HgB A1c   Preterm labor symptoms and general obstetric precautions including but not limited to vaginal bleeding, contractions, leaking of fluid and fetal movement were reviewed in detail with the patient. Please refer to After Visit Summary for other counseling recommendations.    Future Appointments  Date Time Provider Department Center  10/17/2024  3:15 PM [facility] PROVIDER 1 [facility]  10/17/2024  3:30 PM [facility] US4 [facility]  10/22/2024  2:15 PM [facility] PROVIDER 1 [facility]  10/22/2024  2:30 PM [facility] US3 [facility]  10/28/2024  9:15 AM [facility] PROVIDER 1 [facility]  10/28/2024  9:30 AM [facility] US1 [facility]  10/29/2024  3:55 PM Delores Nidia CROME, FNP [facility]  11/05/2024  2:35 PM Vannie Cornell SAUNDERS, CNM [facility]  11/12/2024  1:55 PM Vannie Cornell SAUNDERS, CNM [facility]  11/19/2024  2:35 PM Vannie Cornell SAUNDERS, CNM [facility]  11/26/2024  1:15 PM Vannie Cornell SAUNDERS, CNM [facility]    Tommy Delores, NP-S

## 2024-10-16 LAB — COMPREHENSIVE METABOLIC PANEL WITH GFR
ALT: 12 IU/L (ref 0–32)
AST: 20 IU/L (ref 0–40)
Albumin: 3.6 g/dL — ABNORMAL LOW (ref 3.9–4.9)
Alkaline Phosphatase: 113 IU/L (ref 41–116)
BUN/Creatinine Ratio: 9 (ref 9–23)
BUN: 5 mg/dL — ABNORMAL LOW (ref 6–20)
Bilirubin Total: 0.3 mg/dL (ref 0.0–1.2)
CO2: 17 mmol/L — ABNORMAL LOW (ref 20–29)
Calcium: 9 mg/dL (ref 8.7–10.2)
Chloride: 105 mmol/L (ref 96–106)
Creatinine, Ser: 0.57 mg/dL (ref 0.57–1.00)
Globulin, Total: 3.3 g/dL (ref 1.5–4.5)
Glucose: 105 mg/dL — ABNORMAL HIGH (ref 70–99)
Potassium: 3.8 mmol/L (ref 3.5–5.2)
Sodium: 139 mmol/L (ref 134–144)
Total Protein: 6.9 g/dL (ref 6.0–8.5)
eGFR: 124 mL/min/1.73 (ref >59)

## 2024-10-16 LAB — CBC
Hematocrit: 34.2 % (ref 34.0–46.6)
Hemoglobin: 11.6 g/dL (ref 11.1–15.9)
MCH: 31.8 pg (ref 26.6–33.0)
MCHC: 33.9 g/dL (ref 31.5–35.7)
MCV: 94 fL (ref 79–97)
Platelets: 253 x10E3/uL (ref 150–450)
RBC: 3.65 x10E6/uL — ABNORMAL LOW (ref 3.77–5.28)
RDW: 12.5 % (ref 11.7–15.4)
WBC: 4.7 x10E3/uL (ref 3.4–10.8)

## 2024-10-16 LAB — HEMOGLOBIN A1C
Est. average glucose Bld gHb Est-mCnc: 120 mg/dL
Hgb A1c MFr Bld: 5.8 % — ABNORMAL HIGH (ref 4.8–5.6)

## 2024-10-16 LAB — PROTEIN / CREATININE RATIO, URINE
Creatinine, Urine: 228.3 mg/dL
Protein, Ur: 27.1 mg/dL
Protein/Creat Ratio: 119 mg/g{creat} (ref 0–200)

## 2024-10-17 ENCOUNTER — Ambulatory Visit (HOSPITAL_BASED_OUTPATIENT_CLINIC_OR_DEPARTMENT_OTHER)

## 2024-10-17 ENCOUNTER — Ambulatory Visit: Payer: Self-pay | Admitting: Certified Nurse Midwife

## 2024-10-17 ENCOUNTER — Ambulatory Visit

## 2024-10-17 ENCOUNTER — Other Ambulatory Visit: Payer: Self-pay | Admitting: Obstetrics and Gynecology

## 2024-10-17 ENCOUNTER — Ambulatory Visit: Attending: Obstetrics and Gynecology | Admitting: Maternal & Fetal Medicine

## 2024-10-17 VITALS — BP 127/85 | HR 95

## 2024-10-17 VITALS — BP 127/95 | HR 99

## 2024-10-17 DIAGNOSIS — O09293 Supervision of pregnancy with other poor reproductive or obstetric history, third trimester: Secondary | ICD-10-CM | POA: Diagnosis not present

## 2024-10-17 DIAGNOSIS — O358XX0 Maternal care for other (suspected) fetal abnormality and damage, not applicable or unspecified: Secondary | ICD-10-CM

## 2024-10-17 DIAGNOSIS — Z362 Encounter for other antenatal screening follow-up: Secondary | ICD-10-CM | POA: Insufficient documentation

## 2024-10-17 DIAGNOSIS — Z3493 Encounter for supervision of normal pregnancy, unspecified, third trimester: Secondary | ICD-10-CM

## 2024-10-17 DIAGNOSIS — Z3A35 35 weeks gestation of pregnancy: Secondary | ICD-10-CM

## 2024-10-17 DIAGNOSIS — O99210 Obesity complicating pregnancy, unspecified trimester: Secondary | ICD-10-CM | POA: Diagnosis not present

## 2024-10-17 DIAGNOSIS — O403XX0 Polyhydramnios, third trimester, not applicable or unspecified: Secondary | ICD-10-CM

## 2024-10-17 DIAGNOSIS — O99213 Obesity complicating pregnancy, third trimester: Secondary | ICD-10-CM | POA: Insufficient documentation

## 2024-10-17 NOTE — Procedures (Signed)
 Andrea Mosley Feb 13, 1991 [redacted]w[redacted]d  Fetus A Non-Stress Test Interpretation for 10/17/24  Indication: Unsatisfactory BPP  Fetal Heart Rate A Mode: External Baseline Rate (A): 135 bpm Variability: Moderate Accelerations: 15 x 15 Decelerations: None Multiple birth?: No  Uterine Activity Mode: Palpation, Toco Contraction Frequency (min): none noted Resting Tone Palpated: Relaxed  Interpretation (Fetal Testing) Nonstress Test Interpretation: Reactive Comments: Reviewed with Dr. Kizzie

## 2024-10-20 ENCOUNTER — Telehealth: Payer: Self-pay

## 2024-10-20 ENCOUNTER — Other Ambulatory Visit: Payer: Self-pay | Admitting: *Deleted

## 2024-10-20 DIAGNOSIS — O3660X0 Maternal care for excessive fetal growth, unspecified trimester, not applicable or unspecified: Secondary | ICD-10-CM

## 2024-10-20 DIAGNOSIS — O403XX0 Polyhydramnios, third trimester, not applicable or unspecified: Secondary | ICD-10-CM

## 2024-10-22 ENCOUNTER — Ambulatory Visit (INDEPENDENT_AMBULATORY_CARE_PROVIDER_SITE_OTHER): Payer: Self-pay | Admitting: Certified Nurse Midwife

## 2024-10-22 ENCOUNTER — Ambulatory Visit

## 2024-10-22 ENCOUNTER — Other Ambulatory Visit: Payer: Self-pay

## 2024-10-22 VITALS — BP 109/79 | HR 109 | Wt 370.7 lb

## 2024-10-22 DIAGNOSIS — O403XX0 Polyhydramnios, third trimester, not applicable or unspecified: Secondary | ICD-10-CM

## 2024-10-22 DIAGNOSIS — Z3493 Encounter for supervision of normal pregnancy, unspecified, third trimester: Secondary | ICD-10-CM

## 2024-10-22 DIAGNOSIS — Z3A35 35 weeks gestation of pregnancy: Secondary | ICD-10-CM

## 2024-10-22 NOTE — Progress Notes (Signed)
   PRENATAL VISIT NOTE  Subjective:  Andrea Mosley is a 33 y.o. [phone] at [redacted]w[redacted]d being seen today for ongoing prenatal care.  She is currently monitored for the following issues for this high-risk pregnancy and has Prediabetes; Obesity in pregnancy; Supervision of low-risk pregnancy; Polyhydramnios affecting pregnancy in third trimester; and History of macrosomia in infant in prior pregnancy, currently pregnant, third trimester on their problem list.  Patient reports no complaints.  Contractions: Not present. Vag. Bleeding: None.  Movement: Present. Denies leaking of fluid.   The following portions of the patient's history were reviewed and updated as appropriate: allergies, current medications, past family history, past medical history, past social history, past surgical history and problem list.   Objective:   Vitals:   10/22/24 0835  BP: 109/79  Pulse: (!) 109  Weight: (!) 168.1 kg   Fetal Status:  Fetal Heart Rate (bpm): 134   Movement: Present    General: Alert, oriented and cooperative. Patient is in no acute distress.  Skin: Skin is warm and dry. No rash noted.   Cardiovascular: Normal heart rate noted  Respiratory: Normal respiratory effort, no problems with respiration noted  Abdomen: Soft, gravid, appropriate for gestational age.  Pain/Pressure: Absent     Pelvic: Cervical exam deferred        Extremities: Normal range of motion.  Edema: Mild pitting, slight indentation  Mental Status: Normal mood and affect. Normal behavior. Normal judgment and thought content.   Assessment and Plan:  Pregnancy: [id number] at [redacted]w[redacted]d 1. Encounter for supervision of low-risk pregnancy in third trimester (Primary) - Doing well, feeling regular and vigorous fetal movement  - BP stable today  2. [redacted] weeks gestation of pregnancy - Routine OB care   3. Polyhydramnios affecting pregnancy in third trimester - Followed by MFM - Unable to attend scheduled U/S today due to work; will follow  up on 10/28/24 for U/S  Preterm labor symptoms and general obstetric precautions including but not limited to vaginal bleeding, contractions, leaking of fluid and fetal movement were reviewed in detail with the patient. Please refer to After Visit Summary for other counseling recommendations.   Future Appointments  Date Time Provider Department Center  10/22/2024  2:15 PM [facility] PROVIDER 1 [facility]  10/22/2024  2:30 PM [facility] US3 [facility]  10/28/2024  9:15 AM [facility] PROVIDER 1 [facility]  10/28/2024  9:30 AM [facility] US1 [facility]  11/05/2024  2:35 PM Vannie Cornell SAUNDERS, CNM [facility]  11/12/2024  1:55 PM Vannie Cornell SAUNDERS EDDY [facility]  11/19/2024  2:35 PM Vannie Cornell SAUNDERS, CNM [facility]  11/26/2024  1:15 PM Vannie Cornell SAUNDERS, CNM [facility]    Tommy Daring, NP-S

## 2024-10-28 ENCOUNTER — Ambulatory Visit

## 2024-10-28 ENCOUNTER — Ambulatory Visit: Attending: Maternal & Fetal Medicine | Admitting: Maternal & Fetal Medicine

## 2024-10-28 VITALS — BP 127/88

## 2024-10-28 DIAGNOSIS — Z3A36 36 weeks gestation of pregnancy: Secondary | ICD-10-CM | POA: Diagnosis not present

## 2024-10-28 DIAGNOSIS — Z3493 Encounter for supervision of normal pregnancy, unspecified, third trimester: Secondary | ICD-10-CM

## 2024-10-28 DIAGNOSIS — O99213 Obesity complicating pregnancy, third trimester: Secondary | ICD-10-CM | POA: Diagnosis not present

## 2024-10-28 DIAGNOSIS — O403XX0 Polyhydramnios, third trimester, not applicable or unspecified: Secondary | ICD-10-CM | POA: Insufficient documentation

## 2024-10-28 DIAGNOSIS — O09293 Supervision of pregnancy with other poor reproductive or obstetric history, third trimester: Secondary | ICD-10-CM | POA: Insufficient documentation

## 2024-10-28 DIAGNOSIS — E669 Obesity, unspecified: Secondary | ICD-10-CM | POA: Diagnosis not present

## 2024-10-28 DIAGNOSIS — O358XX0 Maternal care for other (suspected) fetal abnormality and damage, not applicable or unspecified: Secondary | ICD-10-CM

## 2024-10-28 DIAGNOSIS — Z362 Encounter for other antenatal screening follow-up: Secondary | ICD-10-CM | POA: Diagnosis present

## 2024-10-28 NOTE — Progress Notes (Signed)
   Patient information  Patient Name: Andrea Mosley [facility]  Patient MRN:   [id number]  Referring practice: MFM Referring Provider: [facility])  Problem List   Patient Active Problem List   Diagnosis Date Noted   Polyhydramnios affecting pregnancy in third trimester 10/01/2024   History of macrosomia in infant in prior pregnancy, currently pregnant, third trimester 10/01/2024   Supervision of low-risk pregnancy 05/06/2024   Prediabetes 03/28/2024   Obesity in pregnancy 03/28/2024   Maternal Fetal medicine Consult  [facility] WADDELL GOEHRING is a 33 y.o. [id number] at [redacted]w[redacted]d here for ultrasound and consultation. [facility] Andrea Mosley is doing well today with no acute concerns. Today we focused on the following:   Patient is here for growth ultrasound and BPP due to elevated BMI.  She also has mild polyhydramnios.  I discussed the estimated fetal weight is at the 89th percentile and the [facility] is at the 99th percentile.  I reminded her the importance of dietary discretion   And limiting excessive carbohydrates.  Due to elevated BMI the pt will return for weekly antenatal testing.   The patient had time to ask questions that were answered to her satisfaction.  She verbalized understanding and agrees to proceed with the plan below.  Sonographic findings Single intrauterine pregnancy. Fetal cardiac activity: Observed. Presentation: Cephalic. Interval fetal anatomy appears normal. Fetal biometry shows the estimated fetal weight at the 90 percentile. Amniotic fluid: Polyhydramnios.  MVP: 7.81 cm. Placenta: Anterior. BPP: 8/8.   There are limitations of prenatal ultrasound such as the inability to detect certain abnormalities due to poor visualization. Various factors such as fetal position, gestational age and maternal body habitus may increase the difficulty in visualizing the fetal anatomy.    Recommendations - Continue weekly antenatal testing - Avoid excessive  carbohydrate intake - Delivery around EDD or sooner if indicated  Review of Systems: A review of systems was performed and was negative except per HPI   Vitals and Physical Exam    10/28/2024    9:30 AM 10/22/2024    8:35 AM 10/17/2024    4:50 PM  Vitals with BMI  Weight  370 lbs 11 oz   Systolic [phone]  Diastolic 88 79 85  Pulse  109 95    Sitting comfortably on the sonogram table Nonlabored breathing Normal rate and rhythm Abdomen is nontender  Past pregnancies OB History  Gravida Para Term Preterm AB Living  5 3 3  0 1 3  SAB IAB Ectopic Multiple Live Births  1 0 0 0 3    # Outcome Date GA Lbr Len/2nd Weight Sex Type Anes PTL Lv  5 Current           4 Term 12/26/15 [redacted]w[redacted]d  8 lb 7 oz (3.827 kg) F Vag-Spont   LIV  3 SAB 2016          2 Term 01/18/14 [redacted]w[redacted]d  9 lb 7 oz (4.281 kg) M Vag-Spont   LIV  1 Term 06/03/12 [redacted]w[redacted]d  8 lb 6 oz (3.799 kg) M Vag-Spont   LIV     I spent 20 minutes reviewing the patients chart, including labs and images as well as counseling the patient about her medical conditions. Greater than 50% of the time was spent in direct face-to-face patient counseling.  Delora Smaller  MFM, [facility]   10/28/2024  9:58 AM

## 2024-10-29 ENCOUNTER — Encounter: Admitting: Obstetrics and Gynecology

## 2024-11-03 NOTE — Progress Notes (Signed)
 After review, MFM consult with provider is not indicated for today  Andrea Nathanel Pipe, MD 11/03/2024 2:37 PM  [facility]

## 2024-11-04 ENCOUNTER — Ambulatory Visit: Attending: Obstetrics

## 2024-11-04 ENCOUNTER — Ambulatory Visit: Admitting: *Deleted

## 2024-11-04 VITALS — BP 129/90

## 2024-11-04 VITALS — BP 137/91

## 2024-11-04 DIAGNOSIS — O09293 Supervision of pregnancy with other poor reproductive or obstetric history, third trimester: Secondary | ICD-10-CM

## 2024-11-04 DIAGNOSIS — O403XX0 Polyhydramnios, third trimester, not applicable or unspecified: Secondary | ICD-10-CM | POA: Diagnosis present

## 2024-11-04 DIAGNOSIS — O99210 Obesity complicating pregnancy, unspecified trimester: Secondary | ICD-10-CM

## 2024-11-04 DIAGNOSIS — Z3493 Encounter for supervision of normal pregnancy, unspecified, third trimester: Secondary | ICD-10-CM

## 2024-11-04 DIAGNOSIS — Z3A37 37 weeks gestation of pregnancy: Secondary | ICD-10-CM | POA: Diagnosis not present

## 2024-11-04 DIAGNOSIS — O99213 Obesity complicating pregnancy, third trimester: Secondary | ICD-10-CM | POA: Diagnosis present

## 2024-11-04 NOTE — Procedures (Signed)
 Ara Grandmaison 1991/04/28 [redacted]w[redacted]d  Fetus A Non-Stress Test Interpretation for 11/04/24  Indication: Polyhydramnios and obesity - NST only  Fetal Heart Rate A Mode: External Baseline Rate (A): 140 bpm Variability: Moderate Accelerations: 15 x 15 Decelerations: None Multiple birth?: No  Uterine Activity Mode: Palpation Contraction Frequency (min): none noted Resting Tone Palpated: Relaxed  Interpretation (Fetal Testing) Nonstress Test Interpretation: Reactive Comments: Reviewed with Dr. Ileana

## 2024-11-05 ENCOUNTER — Encounter

## 2024-11-05 ENCOUNTER — Other Ambulatory Visit: Payer: Self-pay

## 2024-11-05 ENCOUNTER — Other Ambulatory Visit (HOSPITAL_COMMUNITY)
Admission: RE | Admit: 2024-11-05 | Discharge: 2024-11-05 | Disposition: A | Discharge status: Home / Self Care | Source: Ambulatory Visit | Attending: Certified Nurse Midwife | Admitting: Certified Nurse Midwife

## 2024-11-05 ENCOUNTER — Ambulatory Visit: Admitting: Certified Nurse Midwife

## 2024-11-05 VITALS — BP 128/89 | HR 103 | Wt 377.0 lb

## 2024-11-05 DIAGNOSIS — Z3493 Encounter for supervision of normal pregnancy, unspecified, third trimester: Secondary | ICD-10-CM | POA: Diagnosis present

## 2024-11-05 DIAGNOSIS — Z3A37 37 weeks gestation of pregnancy: Secondary | ICD-10-CM

## 2024-11-05 DIAGNOSIS — O403XX0 Polyhydramnios, third trimester, not applicable or unspecified: Secondary | ICD-10-CM

## 2024-11-05 NOTE — Progress Notes (Signed)
   PRENATAL VISIT NOTE  Subjective:  Andrea Mosley is a 33 y.o. [phone] at [redacted]w[redacted]d being seen today for ongoing prenatal care.  She is currently monitored for the following issues for this low-risk pregnancy and has Prediabetes; Obesity in pregnancy; Supervision of low-risk pregnancy; Polyhydramnios affecting pregnancy in third trimester; and History of macrosomia in infant in prior pregnancy, currently pregnant, third trimester on their problem list.  Patient reports no complaints.  Contractions: Not present. Vag. Bleeding: None.  Movement: Present. Denies leaking of fluid.   The following portions of the patient's history were reviewed and updated as appropriate: allergies, current medications, past family history, past medical history, past social history, past surgical history and problem list.   Objective:   Vitals:   11/05/24 1514  BP: 128/89  Pulse: (!) 103  Weight: (!) 171 kg   Fetal Status:  Fetal Heart Rate (bpm): 145 Fundal Height: 38 cm Movement: Present    General: Alert, oriented and cooperative. Patient is in no acute distress.  Skin: Skin is warm and dry. No rash noted.   Cardiovascular: Normal heart rate noted  Respiratory: Normal respiratory effort, no problems with respiration noted  Abdomen: Soft, gravid, appropriate for gestational age.  Pain/Pressure: Absent     Pelvic: Cervical exam deferred        Extremities: Normal range of motion.  Edema: None  Mental Status: Normal mood and affect. Normal behavior. Normal judgment and thought content.      05/14/2024    4:54 PM  Depression screen PHQ 2/9  Decreased Interest 2  Down, Depressed, Hopeless 3  PHQ - 2 Score 5  Altered sleeping 3  Tired, decreased energy 3  Change in appetite 1  Feeling bad or failure about yourself  1  Trouble concentrating 0  Moving slowly or fidgety/restless 0  Suicidal thoughts 0  PHQ-9 Score 13      Data saved with a previous flowsheet row definition        05/14/2024     4:55 PM  GAD 7 : Generalized Anxiety Score  Nervous, Anxious, on Edge 1  Control/stop worrying 1  Worry too much - different things 1  Trouble relaxing 0  Restless 0  Easily annoyed or irritable 1  Afraid - awful might happen 0  Total GAD 7 Score 4   Assessment and Plan:  Pregnancy: [id number] at [redacted]w[redacted]d 1. Encounter for supervision of low-risk pregnancy in third trimester (Primary) - Doing well, feeling regular and vigorous fetal movement   2. [redacted] weeks gestation of pregnancy - Routine OB care   3. Polyhydramnios affecting pregnancy in third trimester - Followed by MFM. Continue weekly antenatal testing.  Term labor symptoms and general obstetric precautions including but not limited to vaginal bleeding, contractions, leaking of fluid and fetal movement were reviewed in detail with the patient. Please refer to After Visit Summary for other counseling recommendations.   Future Appointments  Date Time Provider Department Center  11/11/2024  3:00 PM [facility] NURSE [facility]  11/11/2024  3:15 PM [facility] NST [facility]  11/12/2024  1:55 PM Vannie Cornell JONELLE EDDY [facility]  11/18/2024  9:30 AM [facility] NURSE [facility]  11/18/2024  9:45 AM [facility] NST [facility]  11/19/2024  2:35 PM Vannie Cornell JONELLE EDDY [facility]  11/26/2024  1:15 PM Vannie Cornell JONELLE, CNM [facility]   Tommy Daring, WHNP-S

## 2024-11-07 LAB — GC/CHLAMYDIA PROBE AMP (~~LOC~~) NOT AT ARMC
Chlamydia: NEGATIVE
Comment: NEGATIVE
Comment: NORMAL
Neisseria Gonorrhea: NEGATIVE

## 2024-11-09 LAB — CULTURE, BETA STREP (GROUP B ONLY): Strep Gp B Culture: POSITIVE — AB

## 2024-11-11 ENCOUNTER — Ambulatory Visit

## 2024-11-11 ENCOUNTER — Telehealth: Payer: Self-pay

## 2024-11-11 DIAGNOSIS — Z3493 Encounter for supervision of normal pregnancy, unspecified, third trimester: Secondary | ICD-10-CM

## 2024-11-11 DIAGNOSIS — O09293 Supervision of pregnancy with other poor reproductive or obstetric history, third trimester: Secondary | ICD-10-CM

## 2024-11-11 NOTE — Telephone Encounter (Signed)
 SPOKE W/PATIENT AND INFORMED ABOUT NST BEING SCHEDULED AT OB OFFICE DUE TO NURSE CALL OUT IN MFM

## 2024-11-12 ENCOUNTER — Ambulatory Visit (INDEPENDENT_AMBULATORY_CARE_PROVIDER_SITE_OTHER): Admitting: *Deleted

## 2024-11-12 ENCOUNTER — Ambulatory Visit: Admitting: Certified Nurse Midwife

## 2024-11-12 ENCOUNTER — Encounter

## 2024-11-12 ENCOUNTER — Other Ambulatory Visit: Payer: Self-pay

## 2024-11-12 ENCOUNTER — Encounter: Payer: Self-pay | Admitting: *Deleted

## 2024-11-12 VITALS — BP 123/87 | HR 103 | Wt 377.0 lb

## 2024-11-12 DIAGNOSIS — Z3A38 38 weeks gestation of pregnancy: Secondary | ICD-10-CM

## 2024-11-12 DIAGNOSIS — O99210 Obesity complicating pregnancy, unspecified trimester: Secondary | ICD-10-CM

## 2024-11-12 DIAGNOSIS — O403XX0 Polyhydramnios, third trimester, not applicable or unspecified: Secondary | ICD-10-CM | POA: Diagnosis not present

## 2024-11-12 DIAGNOSIS — O99820 Streptococcus B carrier state complicating pregnancy: Secondary | ICD-10-CM

## 2024-11-12 DIAGNOSIS — Z3493 Encounter for supervision of normal pregnancy, unspecified, third trimester: Secondary | ICD-10-CM

## 2024-11-12 DIAGNOSIS — O99213 Obesity complicating pregnancy, third trimester: Secondary | ICD-10-CM | POA: Diagnosis not present

## 2024-11-12 HISTORY — DX: Streptococcus B carrier state complicating pregnancy: O99.820

## 2024-11-12 NOTE — Progress Notes (Signed)
 Reactive NST.  No decels, no UC's

## 2024-11-13 ENCOUNTER — Encounter (HOSPITAL_COMMUNITY): Payer: Self-pay

## 2024-11-13 ENCOUNTER — Ambulatory Visit: Payer: Self-pay | Admitting: Certified Nurse Midwife

## 2024-11-13 NOTE — Progress Notes (Signed)
 PRENATAL VISIT NOTE  Subjective:  Andrea Mosley is a 33 y.o. [phone] at [redacted]w[redacted]d being seen today for ongoing prenatal care.  She is currently monitored for the following issues for this high-risk pregnancy and has Prediabetes; Obesity in pregnancy; Supervision of low-risk pregnancy; Polyhydramnios affecting pregnancy in third trimester; History of macrosomia in infant in prior pregnancy, currently pregnant, third trimester; and GBS (group B Streptococcus carrier), +RV culture, currently pregnant on their problem list.  Patient reports no complaints, expressed annoyance at having GBS as she did not have it with previous pregnancies (new partner).  Contractions: Not present. Vag. Bleeding: None.  Movement: Present. Denies leaking of fluid.   The following portions of the patient's history were reviewed and updated as appropriate: allergies, current medications, past family history, past medical history, past social history, past surgical history and problem list.   Objective:   Vitals:   11/12/24 1420 11/12/24 1650  BP: (!) 131/91 123/87  Pulse: 90 (!) 103  Weight: (!) 377 lb (171 kg)    Fetal Status:      Movement: Present    General: Alert, oriented and cooperative. Patient is in no acute distress.  Skin: Skin is warm and dry. No rash noted.   Cardiovascular: Normal heart rate noted  Respiratory: Normal respiratory effort, no problems with respiration noted  Abdomen: Soft, gravid, appropriate for gestational age.  Pain/Pressure: Absent     Pelvic: Cervical exam deferred        Extremities: Normal range of motion.  Edema: None  Mental Status: Normal mood and affect. Normal behavior. Normal judgment and thought content.      05/14/2024    4:54 PM  Depression screen PHQ 2/9  Decreased Interest 2  Down, Depressed, Hopeless 3  PHQ - 2 Score 5  Altered sleeping 3  Tired, decreased energy 3  Change in appetite 1  Feeling bad or failure about yourself  1  Trouble concentrating  0  Moving slowly or fidgety/restless 0  Suicidal thoughts 0  PHQ-9 Score 13      Data saved with a previous flowsheet row definition       05/14/2024    4:55 PM  GAD 7 : Generalized Anxiety Score  Nervous, Anxious, on Edge 1  Control/stop worrying 1  Worry too much - different things 1  Trouble relaxing 0  Restless 0  Easily annoyed or irritable 1  Afraid - awful might happen 0  Total GAD 7 Score 4   Assessment and Plan:  Pregnancy: [id number] at [redacted]w[redacted]d 1. Encounter for supervision of low-risk pregnancy in third trimester - Doing well, feeling regular and vigorous fetal movement   2. [redacted] weeks gestation of pregnancy - Routine OB care   3. Polyhydramnios affecting pregnancy in third trimester (Primary) - Last AFI = 26  4. Obesity in pregnancy - Being followed by MFM, recommended delivery by 40 weeks - IOL scheduled for 39-5 (due to scheduling), orders placed  5. GBS (group B Streptococcus carrier), +RV culture, currently pregnant - Will treat in labor, explained it is not an STI   Term labor symptoms and general obstetric precautions including but not limited to vaginal bleeding, contractions, leaking of fluid and fetal movement were reviewed in detail with the patient. Please refer to After Visit Summary for other counseling recommendations.   No follow-ups on file.  Future Appointments  Date Time Provider Department Center  11/19/2024  7:00 AM [facility] SCHED ROOM [facility] None  11/19/2024  2:35 PM Vannie,  Cornell JONELLE HOWARD [facility]  11/19/2024  3:15 PM [facility] NST [facility]  11/26/2024  1:15 PM Vannie Cornell JONELLE, CNM [facility]   Cornell JONELLE Vannie, CNM

## 2024-11-14 ENCOUNTER — Encounter (HOSPITAL_COMMUNITY): Payer: Self-pay | Admitting: *Deleted

## 2024-11-14 ENCOUNTER — Telehealth (HOSPITAL_COMMUNITY): Payer: Self-pay | Admitting: *Deleted

## 2024-11-14 NOTE — Telephone Encounter (Signed)
 Preadmission screen

## 2024-11-18 ENCOUNTER — Ambulatory Visit

## 2024-11-18 DIAGNOSIS — Z3493 Encounter for supervision of normal pregnancy, unspecified, third trimester: Secondary | ICD-10-CM

## 2024-11-18 DIAGNOSIS — O09293 Supervision of pregnancy with other poor reproductive or obstetric history, third trimester: Secondary | ICD-10-CM

## 2024-11-19 ENCOUNTER — Other Ambulatory Visit

## 2024-11-19 ENCOUNTER — Inpatient Hospital Stay (HOSPITAL_COMMUNITY)

## 2024-11-19 ENCOUNTER — Encounter: Admitting: Certified Nurse Midwife

## 2024-11-21 ENCOUNTER — Encounter (HOSPITAL_COMMUNITY): Payer: Self-pay | Admitting: Family Medicine

## 2024-11-21 ENCOUNTER — Inpatient Hospital Stay (HOSPITAL_COMMUNITY)
Admission: RE | Admit: 2024-11-21 | Discharge: 2024-11-21 | Disposition: A | Source: Ambulatory Visit | Attending: Obstetrics and Gynecology

## 2024-11-21 ENCOUNTER — Other Ambulatory Visit: Payer: Self-pay

## 2024-11-21 ENCOUNTER — Inpatient Hospital Stay (HOSPITAL_COMMUNITY)
Admission: AD | Admit: 2024-11-21 | Discharge: 2024-11-24 | DRG: 807 | Disposition: A | Discharge status: Home / Self Care | Attending: Obstetrics and Gynecology | Admitting: Obstetrics and Gynecology

## 2024-11-21 DIAGNOSIS — Z3493 Encounter for supervision of normal pregnancy, unspecified, third trimester: Secondary | ICD-10-CM

## 2024-11-21 DIAGNOSIS — Z833 Family history of diabetes mellitus: Secondary | ICD-10-CM | POA: Diagnosis not present

## 2024-11-21 DIAGNOSIS — O99824 Streptococcus B carrier state complicating childbirth: Secondary | ICD-10-CM | POA: Diagnosis present

## 2024-11-21 DIAGNOSIS — Z8249 Family history of ischemic heart disease and other diseases of the circulatory system: Secondary | ICD-10-CM | POA: Diagnosis not present

## 2024-11-21 DIAGNOSIS — O403XX0 Polyhydramnios, third trimester, not applicable or unspecified: Secondary | ICD-10-CM | POA: Diagnosis present

## 2024-11-21 DIAGNOSIS — O480 Post-term pregnancy: Secondary | ICD-10-CM | POA: Diagnosis not present

## 2024-11-21 DIAGNOSIS — O134 Gestational [pregnancy-induced] hypertension without significant proteinuria, complicating childbirth: Secondary | ICD-10-CM | POA: Diagnosis present

## 2024-11-21 DIAGNOSIS — O326XX0 Maternal care for compound presentation, not applicable or unspecified: Secondary | ICD-10-CM | POA: Diagnosis present

## 2024-11-21 DIAGNOSIS — Z3A40 40 weeks gestation of pregnancy: Secondary | ICD-10-CM | POA: Diagnosis not present

## 2024-11-21 DIAGNOSIS — O99820 Streptococcus B carrier state complicating pregnancy: Secondary | ICD-10-CM

## 2024-11-21 DIAGNOSIS — O09293 Supervision of pregnancy with other poor reproductive or obstetric history, third trimester: Principal | ICD-10-CM

## 2024-11-21 DIAGNOSIS — O135 Gestational [pregnancy-induced] hypertension without significant proteinuria, complicating the puerperium: Secondary | ICD-10-CM | POA: Diagnosis not present

## 2024-11-21 DIAGNOSIS — O99210 Obesity complicating pregnancy, unspecified trimester: Secondary | ICD-10-CM | POA: Diagnosis present

## 2024-11-21 DIAGNOSIS — O99214 Obesity complicating childbirth: Principal | ICD-10-CM | POA: Diagnosis present

## 2024-11-21 LAB — CBC
HCT: 32.5 % — ABNORMAL LOW (ref 36.0–46.0)
Hemoglobin: 11 g/dL — ABNORMAL LOW (ref 12.0–15.0)
MCH: 30.7 pg (ref 26.0–34.0)
MCHC: 33.8 g/dL (ref 30.0–36.0)
MCV: 90.8 fL (ref 80.0–100.0)
Platelets: 230 K/uL (ref 150–400)
RBC: 3.58 MIL/uL — ABNORMAL LOW (ref 3.87–5.11)
RDW: 13.2 % (ref 11.5–15.5)
WBC: 6.1 K/uL (ref 4.0–10.5)
nRBC: 0 % (ref 0.0–0.2)

## 2024-11-21 LAB — SYPHILIS: RPR W/REFLEX TO RPR TITER AND TREPONEMAL ANTIBODIES, TRADITIONAL SCREENING AND DIAGNOSIS ALGORITHM: RPR Ser Ql: NONREACTIVE

## 2024-11-21 LAB — TYPE AND SCREEN
ABO/RH(D): AB POS
Antibody Screen: NEGATIVE

## 2024-11-21 MED ORDER — MISOPROSTOL 50MCG HALF TABLET
50.0000 ug | ORAL_TABLET | Freq: Once | ORAL | Status: AC
  Administered 2024-11-21: 50 ug via ORAL
  Filled 2024-11-21: qty 1

## 2024-11-21 MED ORDER — FLEET ENEMA RE ENEM: 1.0000 | ENEMA | RECTAL | Status: DC | PRN

## 2024-11-21 MED ORDER — LACTATED RINGERS IV SOLN
500.0000 mL | INTRAVENOUS | Status: DC | PRN
  Administered 2024-11-21 – 2024-11-22 (×2): 500 mL via INTRAVENOUS

## 2024-11-21 MED ORDER — FENTANYL CITRATE (PF) 100 MCG/2ML IJ SOLN
50.0000 ug | INTRAMUSCULAR | Status: DC | PRN
Start: 2024-11-21 — End: 2024-11-22
  Administered 2024-11-21 – 2024-11-22 (×4): 100 ug via INTRAVENOUS
  Filled 2024-11-21 (×4): qty 2

## 2024-11-21 MED ORDER — LACTATED RINGERS IV SOLN: INTRAVENOUS | Status: DC

## 2024-11-21 MED ORDER — ACETAMINOPHEN 325 MG PO TABS: 650.0000 mg | ORAL_TABLET | ORAL | Status: DC | PRN

## 2024-11-21 MED ORDER — TERBUTALINE SULFATE 1 MG/ML IJ SOLN: 0.2500 mg | Freq: Once | INTRAMUSCULAR | Status: DC | PRN

## 2024-11-21 MED ORDER — OXYCODONE-ACETAMINOPHEN 5-325 MG PO TABS: 1.0000 | ORAL_TABLET | ORAL | Status: DC | PRN

## 2024-11-21 MED ORDER — SOD CITRATE-CITRIC ACID 500-334 MG/5ML PO SOLN: 30.0000 mL | ORAL | Status: DC | PRN

## 2024-11-21 MED ORDER — OXYCODONE-ACETAMINOPHEN 5-325 MG PO TABS: 2.0000 | ORAL_TABLET | ORAL | Status: DC | PRN

## 2024-11-21 MED ORDER — MISOPROSTOL 25 MCG QUARTER TABLET
25.0000 ug | ORAL_TABLET | Freq: Once | ORAL | Status: AC
  Administered 2024-11-21: 25 ug via VAGINAL
  Filled 2024-11-21: qty 1

## 2024-11-21 MED ORDER — MISOPROSTOL 50MCG HALF TABLET
50.0000 ug | ORAL_TABLET | ORAL | Status: DC | PRN
  Administered 2024-11-21: 50 ug via BUCCAL
  Filled 2024-11-21: qty 1

## 2024-11-21 MED ORDER — LIDOCAINE HCL (PF) 1 % IJ SOLN: 30.0000 mL | INTRAMUSCULAR | Status: DC | PRN

## 2024-11-21 MED ORDER — ONDANSETRON HCL 4 MG/2ML IJ SOLN: 4.0000 mg | Freq: Four times a day (QID) | INTRAMUSCULAR | Status: DC | PRN

## 2024-11-21 MED ORDER — OXYTOCIN BOLUS FROM INFUSION
333.0000 mL | Freq: Once | INTRAVENOUS | Status: AC
  Administered 2024-11-22: 333 mL via INTRAVENOUS

## 2024-11-21 MED ORDER — PENICILLIN G POT IN DEXTROSE 60000 UNIT/ML IV SOLN
3.0000 10*6.[IU] | INTRAVENOUS | Status: DC
  Administered 2024-11-21 – 2024-11-22 (×6): 3 10*6.[IU] via INTRAVENOUS
  Filled 2024-11-21 (×8): qty 50

## 2024-11-21 MED ORDER — SODIUM CHLORIDE 0.9 % IV SOLN
5.0000 10*6.[IU] | Freq: Once | INTRAVENOUS | Status: AC
  Administered 2024-11-21: 5 10*6.[IU] via INTRAVENOUS
  Filled 2024-11-21: qty 5

## 2024-11-21 MED ORDER — OXYTOCIN-SODIUM CHLORIDE 30-0.9 UT/500ML-% IV SOLN
2.5000 [IU]/h | INTRAVENOUS | Status: DC
  Administered 2024-11-22: 2.5 [IU]/h via INTRAVENOUS

## 2024-11-21 MED ORDER — OXYTOCIN-SODIUM CHLORIDE 30-0.9 UT/500ML-% IV SOLN
1.0000 m[IU]/min | INTRAVENOUS | Status: DC
  Administered 2024-11-21: 2 m[IU]/min via INTRAVENOUS
  Filled 2024-11-21: qty 500

## 2024-11-21 NOTE — Progress Notes (Signed)
 Labor Progress Note Andrea Mosley is a 33 y.o. [phone] at [redacted]w[redacted]d presented for IOL for elevated BMI  S:  Comfortable, asking if she is able to go home since her labor isn't progressing  O:  BP 132/78   Pulse 97   Temp 97.9 F (36.6 C) (Axillary)   Resp 16   Ht 5' 8 (1.727 m)   Wt (!) 175.7 kg   LMP 02/15/2024 (Exact Date)   BMI 58.89 kg/m  EFM: baseline 150 bpm/ moderate variability/ + accels/ - decels  Toco/IUPC: ctx q2-45min SVE: Dilation: 1 Effacement (%): Thick Station: -3 Presentation: Vertex Exam by:: Dr. Jomarie  A/P: 33 y.o. [id number] [redacted]w[redacted]d  1. Labor: Buccal miso ordered for patient, however she is contemplating not continuing with IOL, medication held until she makes a decision 2. Pain: Per patient preference 3. GBS positive, on PCN for ppx    Charlie DELENA Jomarie, MD 2:31 PM

## 2024-11-21 NOTE — Progress Notes (Signed)
 Patient expressing that she would like to go home instead of continuing with induction of labor. States that she did not really want an induction to begin with.  MD made aware and patient given some time to contemplate how she would like to proceed.   RN back to patient room after about 5-10 minutes. Patient asked questions about how labor might happen if she goes home vs what induction would look like if she were to stay. RN answered patient's questions and made clear that there is no real way to predict how spontaneous labor or induction will go.  Patient stated that she would proceed with induction at this time but would like to go home if she is not in labor by tomorrow. MD made aware.

## 2024-11-21 NOTE — Progress Notes (Signed)
 Labor Progress Note Andrea Mosley is a 33 y.o. [phone] at [redacted]w[redacted]d presented for IOL for elevated BMI  S:  Comfortable, has questions re: IOL process and next steps  O:  BP 128/72   Pulse 97   Temp 98.1 F (36.7 C) (Oral)   Resp 18   Ht 5' 8 (1.727 m)   Wt (!) 175.7 kg   LMP 02/15/2024 (Exact Date)   BMI 58.89 kg/m  EFM: baseline 150 bpm/ moderate variability/ + accels/ - decels  Toco/IUPC: ctx q2-52min SVE: Dilation: 3 Effacement (%): 50 Station: Ballotable Presentation: Vertex Exam by:: Dr. Jomarie  A/P: 33 y.o. [id number] [redacted]w[redacted]d  1. Labor: Will start pitocin for augmentation, has been on intermittent monitoring, discussed that she will need to be on continuous monitoring moving forward, patient amenable 2. Pain: Epidural when ready 3. GBS positive, PCN ppx   Charlie DELENA Jomarie, MD 7:08 PM

## 2024-11-21 NOTE — H&P (Signed)
 OBSTETRIC ADMISSION HISTORY AND PHYSICAL  Andrea Mosley is a 33 y.o. female [phone] with IUP at [redacted]w[redacted]d by LMP presenting for scheduled IOL for BMI per MFM recommendations. She reports +FMs, No LOF, no VB, no blurry vision, headaches or peripheral edema, and RUQ pain.  She plans on breast feeding. She request Nexplanon for birth control. She received her prenatal [facility] at [facility]   Dating: By LMP --->  Estimated Date of Delivery: 11/21/24  Sono:    @[redacted]w[redacted]d , CWD, normal anatomy, cephalic presentation, 3414g, 09% EFW   Prenatal History/Complications: elevated BMI, mild polyhydramnios, GBS positive  Past Medical History: Past Medical History:  Diagnosis Date   Medical history non-contributory     Past Surgical History: Past Surgical History:  Procedure Laterality Date   OPEN REDUCTION SHOULDER DISLOCATION Right 10/30/2016    Obstetrical History: OB History     Gravida  5   Para  3   Term  3   Preterm  0   AB  1   Living  3      SAB  1   IAB  0   Ectopic  0   Multiple  0   Live Births  3           Social History Social History   Socioeconomic History   Marital status: Single    Spouse name: Not on file   Number of children: Not on file   Years of education: Not on file   Highest education level: Not on file  Occupational History   Occupation: runner, broadcasting/film/video    Comment: for private school  Tobacco Use   Smoking status: Never   Smokeless tobacco: Never  Vaping Use   Vaping status: Never Used  Substance and Sexual Activity   Alcohol use: No   Drug use: No   Sexual activity: Not Currently  Other Topics Concern   Not on file  Social History Narrative   ** Merged History Encounter **       Social Drivers of Health   Financial Resource Strain: Not on file  Food Insecurity: No Food Insecurity (11/21/2024)   Hunger Vital Sign    Worried About Running Out of Food in the Last Year: Never true    Ran Out of Food in the Last Year: Never true   Transportation Needs: No Transportation Needs (11/21/2024)   PRAPARE - Administrator, Civil Service (Medical): No    Lack of Transportation (Non-Medical): No  Physical Activity: Not on file  Stress: Not on file  Social Connections: Patient Declined (11/21/2024)   Social Connection and Isolation Panel    Frequency of Communication with Friends and Family: Patient declined    Frequency of Social Gatherings with Friends and Family: Patient declined    Attends Religious Services: Patient declined    Database Administrator or Organizations: Patient declined    Attends Banker Meetings: Patient declined    Marital Status: Patient declined    Family History: Family History  Problem Relation Age of Onset   Hypertension Mother    Asthma Son    Diabetes Maternal Grandmother    Cancer Neg Hx    Heart disease Neg Hx     Allergies: Allergies  Allergen Reactions   Prednisone Swelling and Rash    Medications Prior to Admission  Medication Sig Dispense Refill Last Dose/Taking   Prenatal Vit-Fe Fumarate-FA (PRENATAL PLUS VITAMIN/MINERAL) 27-1 MG TABS Take 1 tablet by mouth daily. 30 tablet  11 11/21/2024 Morning   Vitamin D , Ergocalciferol , (DRISDOL ) 1.25 MG (50000 UNIT) CAPS capsule TAKE 1 CAPSULE (50,000 UNITS TOTAL) BY MOUTH EVERY 7 (SEVEN) DAYS (Patient not taking: Reported on 10/28/2024) 12 capsule 0 Not Taking     Review of Systems   All systems reviewed and negative except as stated in HPI  Blood pressure 127/69, pulse 88, temperature 97.9 F (36.6 C), temperature source Axillary, resp. rate 18, height 5' 8 (1.727 m), weight (!) 175.7 kg, last menstrual period 02/15/2024. General appearance: alert, cooperative, appears stated age, and no distress Lungs: clear to auscultation bilaterally Heart: regular rate and rhythm Abdomen: soft, non-tender; bowel sounds normal Extremities: Homans sign is negative, no sign of DVT Presentation: cephalic Fetal  monitoringBaseline: 150 bpm, Variability: Good {> 6 bpm), Accelerations: Reactive, and Decelerations: Absent Uterine activityNone Dilation: 1 Effacement (%): Thick Station: -3 Exam by:: KYM Eagles, RNC   Prenatal labs: ABO, Rh: --/--/AB POS (11/28 9047) Antibody: NEG (11/28 9047) Rubella: Immune (04/16 1615) RPR: Non Reactive (09/10 0900)  HBsAg: Negative (04/16 1615)  HIV: Non Reactive (09/10 0900)  GBS: Positive/-- (11/12 1727)    Lab Results  Component Value Date   GBS Positive (A) 11/05/2024   GTT normal early GTT Genetic screening  low-risk Anatomy US  normal  Immunization History  Administered Date(s) Administered   Tdap 10/15/2024    Prenatal Transfer Tool  Maternal Diabetes: No Genetic Screening: Normal Maternal Ultrasounds/Referrals: Normal Fetal Ultrasounds or other Referrals:  None Maternal Substance Abuse:  No Significant Maternal Medications:  None Significant Maternal Lab Results: Group B Strep positive Number of Prenatal Visits:greater than 3 verified prenatal visits Maternal Vaccinations:TDap Other Comments:  None   Results for orders placed or performed during the hospital encounter of 11/21/24 (from the past 24 hours)  Type and screen   Collection Time: 11/21/24  9:52 AM  Result Value Ref Range   ABO/RH(D) AB POS    Antibody Screen NEG    Sample Expiration      11/24/2024,2359 Performed at [facility], [address], [place], [place] [address]   CBC   Collection Time: 11/21/24  9:53 AM  Result Value Ref Range   WBC 6.1 4.0 - 10.5 K/uL   RBC 3.58 (L) 3.87 - 5.11 MIL/uL   Hemoglobin 11.0 (L) 12.0 - 15.0 g/dL   HCT 67.4 (L) 63.9 - 53.9 %   MCV 90.8 80.0 - 100.0 fL   MCH 30.7 26.0 - 34.0 pg   MCHC 33.8 30.0 - 36.0 g/dL   RDW 86.7 88.4 - 84.4 %   Platelets 230 150 - 400 K/uL   nRBC 0.0 0.0 - 0.2 %    Patient Active Problem List   Diagnosis Date Noted   Indication for care in labor or delivery 11/21/2024   GBS (group B  Streptococcus carrier), +RV culture, currently pregnant 11/12/2024   Polyhydramnios affecting pregnancy in third trimester 10/01/2024   History of macrosomia in infant in prior pregnancy, currently pregnant, third trimester 10/01/2024   Supervision of low-risk pregnancy 05/06/2024   Prediabetes 03/28/2024   Obesity in pregnancy 03/28/2024    Assessment/Plan:  Andrea Mosley is a 33 y.o. H4E6986 at [redacted]w[redacted]d here for IOL for elevated BMI  #Labor: IOL - will start with dual cytotec, patient hesitant about balloon #Pain: Per patient preference #FWB: Category I #GBS status:  Positive, will plan for PCN while in labor #Feeding: Breastmilk  #Reproductive Life planning: Nexplanon #Circ:  yes  Charlie DELENA Courts, MD  11/21/2024, 11:49 AM

## 2024-11-21 NOTE — Progress Notes (Signed)
 Patient ID: Andrea Mosley, female   DOB: March 25, 1991, 33 y.o.   MRN: [id number]  Just getting up to ambulate to the bathroom; feeling ctx stronger now and breathing with then; s/p cytotec and then Pit started at 1945  BPs 135/75, 128/72, other VSS FHR 145-150s, +accels, no decels Ctx irreg 2-4 mins with Pit at 53mu/min Cx deferred (was 3/50/ballot @ start of Pit)  IUP@40 .0wks IOL process  Continue to uptitrate Pit to achieve active labor Anticipate vag delivery  Andrea Mosley CNM 11/21/2024 9:01 PM

## 2024-11-22 ENCOUNTER — Encounter (HOSPITAL_COMMUNITY): Payer: Self-pay | Admitting: Family Medicine

## 2024-11-22 ENCOUNTER — Inpatient Hospital Stay (HOSPITAL_COMMUNITY): Admitting: Anesthesiology

## 2024-11-22 DIAGNOSIS — O403XX0 Polyhydramnios, third trimester, not applicable or unspecified: Secondary | ICD-10-CM

## 2024-11-22 DIAGNOSIS — O480 Post-term pregnancy: Secondary | ICD-10-CM

## 2024-11-22 DIAGNOSIS — O326XX0 Maternal care for compound presentation, not applicable or unspecified: Secondary | ICD-10-CM

## 2024-11-22 DIAGNOSIS — Z3A40 40 weeks gestation of pregnancy: Secondary | ICD-10-CM

## 2024-11-22 DIAGNOSIS — O99214 Obesity complicating childbirth: Secondary | ICD-10-CM

## 2024-11-22 DIAGNOSIS — O135 Gestational [pregnancy-induced] hypertension without significant proteinuria, complicating the puerperium: Secondary | ICD-10-CM

## 2024-11-22 DIAGNOSIS — O99820 Streptococcus B carrier state complicating pregnancy: Secondary | ICD-10-CM

## 2024-11-22 MED ORDER — LACTATED RINGERS IV SOLN: 500.0000 mL | INTRAVENOUS | Status: DC | PRN

## 2024-11-22 MED ORDER — SENNOSIDES-DOCUSATE SODIUM 8.6-50 MG PO TABS
2.0000 | ORAL_TABLET | Freq: Every day | ORAL | Status: DC
  Administered 2024-11-23 – 2024-11-24 (×2): 2 via ORAL
  Filled 2024-11-22 (×2): qty 2

## 2024-11-22 MED ORDER — EPHEDRINE 5 MG/ML INJ: 10.0000 mg | INTRAVENOUS | Status: DC | PRN

## 2024-11-22 MED ORDER — NIFEDIPINE ER OSMOTIC RELEASE 30 MG PO TB24
30.0000 mg | ORAL_TABLET | Freq: Every day | ORAL | Status: DC
  Administered 2024-11-22 – 2024-11-24 (×3): 30 mg via ORAL
  Filled 2024-11-22 (×3): qty 1

## 2024-11-22 MED ORDER — ACETAMINOPHEN 325 MG PO TABS
650.0000 mg | ORAL_TABLET | ORAL | Status: DC | PRN
  Administered 2024-11-22: 650 mg via ORAL
  Filled 2024-11-22: qty 2

## 2024-11-22 MED ORDER — DIPHENHYDRAMINE HCL 25 MG PO CAPS: 25.0000 mg | ORAL_CAPSULE | Freq: Four times a day (QID) | ORAL | Status: DC | PRN

## 2024-11-22 MED ORDER — WITCH HAZEL-GLYCERIN EX PADS: 1.0000 | MEDICATED_PAD | CUTANEOUS | Status: DC | PRN

## 2024-11-22 MED ORDER — PHENYLEPHRINE 80 MCG/ML (10ML) SYRINGE FOR IV PUSH (FOR BLOOD PRESSURE SUPPORT): 80.0000 ug | PREFILLED_SYRINGE | INTRAVENOUS | Status: DC | PRN

## 2024-11-22 MED ORDER — LIDOCAINE-EPINEPHRINE (PF) 2 %-1:200000 IJ SOLN
INTRAMUSCULAR | Status: DC | PRN
  Administered 2024-11-22: 3 mL via EPIDURAL

## 2024-11-22 MED ORDER — IBUPROFEN 600 MG PO TABS
600.0000 mg | ORAL_TABLET | Freq: Four times a day (QID) | ORAL | Status: DC
  Administered 2024-11-22 – 2024-11-24 (×8): 600 mg via ORAL
  Filled 2024-11-22 (×8): qty 1

## 2024-11-22 MED ORDER — FENTANYL-BUPIVACAINE-NACL 0.5-0.125-0.9 MG/250ML-% EP SOLN: 12.0000 mL/h | EPIDURAL | Status: DC | PRN

## 2024-11-22 MED ORDER — CEFAZOLIN SODIUM-DEXTROSE 3-4 GM/150ML-% IV SOLN
3.0000 g | INTRAVENOUS | Status: AC
  Administered 2024-11-22: 3 g via INTRAVENOUS
  Filled 2024-11-22 (×2): qty 150

## 2024-11-22 MED ORDER — FUROSEMIDE 20 MG PO TABS
20.0000 mg | ORAL_TABLET | Freq: Every day | ORAL | Status: DC
  Administered 2024-11-22 – 2024-11-24 (×3): 20 mg via ORAL
  Filled 2024-11-22 (×3): qty 1

## 2024-11-22 MED ORDER — BENZOCAINE-MENTHOL 20-0.5 % EX AERO: 1.0000 | INHALATION_SPRAY | CUTANEOUS | Status: DC | PRN

## 2024-11-22 MED ORDER — DIPHENHYDRAMINE HCL 50 MG/ML IJ SOLN: 12.5000 mg | INTRAMUSCULAR | Status: DC | PRN

## 2024-11-22 MED ORDER — FENTANYL-BUPIVACAINE-NACL 0.5-0.125-0.9 MG/250ML-% EP SOLN
12.0000 mL/h | EPIDURAL | Status: DC | PRN
  Administered 2024-11-22: 12 mL/h via EPIDURAL
  Filled 2024-11-22: qty 250

## 2024-11-22 MED ORDER — VITAMIN K1 1 MG/0.5ML IJ SOLN
INTRAMUSCULAR | Status: AC
  Filled 2024-11-22: qty 0.5

## 2024-11-22 MED ORDER — ONDANSETRON HCL 4 MG PO TABS: 4.0000 mg | ORAL_TABLET | ORAL | Status: DC | PRN

## 2024-11-22 MED ORDER — SIMETHICONE 80 MG PO CHEW: 80.0000 mg | CHEWABLE_TABLET | ORAL | Status: DC | PRN

## 2024-11-22 MED ORDER — LACTATED RINGERS IV SOLN: 500.0000 mL | Freq: Once | INTRAVENOUS | Status: DC

## 2024-11-22 MED ORDER — PRENATAL MULTIVITAMIN CH
1.0000 | ORAL_TABLET | Freq: Every day | ORAL | Status: DC
  Administered 2024-11-23 – 2024-11-24 (×2): 1 via ORAL
  Filled 2024-11-22 (×2): qty 1

## 2024-11-22 MED ORDER — POTASSIUM CHLORIDE CRYS ER 20 MEQ PO TBCR
20.0000 meq | EXTENDED_RELEASE_TABLET | Freq: Every day | ORAL | Status: DC
  Administered 2024-11-22 – 2024-11-24 (×3): 20 meq via ORAL
  Filled 2024-11-22 (×3): qty 1

## 2024-11-22 MED ORDER — LACTATED RINGERS IV SOLN: INTRAVENOUS | Status: DC

## 2024-11-22 MED ORDER — COCONUT OIL OIL
1.0000 | TOPICAL_OIL | Status: DC | PRN
  Administered 2024-11-23 – 2024-11-24 (×2): 1 via TOPICAL

## 2024-11-22 MED ORDER — LACTATED RINGERS IV SOLN
500.0000 mL | Freq: Once | INTRAVENOUS | Status: AC
  Administered 2024-11-22: 500 mL via INTRAVENOUS

## 2024-11-22 MED ORDER — ONDANSETRON HCL 4 MG/2ML IJ SOLN: 4.0000 mg | INTRAMUSCULAR | Status: DC | PRN

## 2024-11-22 MED ORDER — ZOLPIDEM TARTRATE 5 MG PO TABS: 5.0000 mg | ORAL_TABLET | Freq: Every evening | ORAL | Status: DC | PRN

## 2024-11-22 MED ORDER — DIBUCAINE (PERIANAL) 1 % EX OINT: 1.0000 | TOPICAL_OINTMENT | CUTANEOUS | Status: DC | PRN

## 2024-11-22 NOTE — Progress Notes (Signed)
 Patient ID: Andrea Mosley, female   DOB: 1991-04-14, 33 y.o.   MRN: [id number]  Called by RN at [phone] as pt was wanting AROM; CNM was upstairs tending to postpartum needs; attempted to set up AROM at 0655 however pt had just fallen asleep; CNM encouraged nursing to call when patient wakes up and is ready.  Suzen JONETTA Gentry CNM 11/22/2024

## 2024-11-22 NOTE — Progress Notes (Signed)
 Patient ID: Andrea Mosley, female   DOB: 1991/11/17, 33 y.o.   MRN: [id number]  Pt sitting at side of bed, getting ready to ambulate to the bathroom; feeling ctx more  BP 134/83 FHR 130-140s, +accels, no decels Ctx q 2-4 mins with Pit at 37mu/min Cx deferred (was 6/90/vtx -2-3 per RN exam at 0244)  IUP@40 .1wk IOL process  Discussion regarding option of AROM; she is going to think more about it and will let nursing know if/when she is ready  Suzen JONETTA Gentry CNM 11/22/2024

## 2024-11-22 NOTE — Anesthesia Procedure Notes (Signed)
 Epidural Patient location during procedure: OB Start time: 11/22/2024 9:28 AM End time: 11/22/2024 9:33 AM  Staffing Anesthesiologist: Tilford Franky BIRCH, MD Performed: anesthesiologist   Preanesthetic Checklist Completed: patient identified, IV checked, site marked, risks and benefits discussed, surgical consent, monitors and equipment checked, pre-op evaluation and timeout performed  Epidural Patient position: sitting Prep: DuraPrep Patient monitoring: heart rate, continuous pulse ox and blood pressure Approach: midline Location: L3-L4 Injection technique: LOR saline  Needle:  Needle type: Tuohy  Needle gauge: 17 G Needle length: 9 cm Catheter type: closed end flexible Catheter size: 20 Guage Test dose: negative and 1.5% lidocaine   Assessment Events: blood not aspirated, no cerebrospinal fluid, injection not painful, no injection resistance and no paresthesia  Additional Notes LOR @ 8  Patient identified. Risks/Benefits/Options discussed with patient including but not limited to bleeding, infection, nerve damage, paralysis, failed block, incomplete pain control, headache, blood pressure changes, nausea, vomiting, reactions to medications, itching and postpartum back pain. Confirmed with bedside nurse the patient's most recent platelet count. Confirmed with patient that they are not currently taking any anticoagulation, have any bleeding history or any family history of bleeding disorders. Patient expressed understanding and wished to proceed. All questions were answered. Sterile technique was used throughout the entire procedure. Please see nursing notes for vital signs. Test dose was given through epidural catheter and negative prior to continuing to dose epidural or start infusion. Warning signs of high block given to the patient including shortness of breath, tingling/numbness in hands, complete motor block, or any concerning symptoms with instructions to call for help. Patient  was given instructions on fall risk and not to get out of bed. All questions and concerns addressed with instructions to call with any issues or inadequate analgesia.    Reason for block:procedure for pain

## 2024-11-22 NOTE — Progress Notes (Signed)
 Labor Progress Note Andrea Mosley is a 33 y.o. [id number] at [redacted]w[redacted]d presented for IOL for elevated BMI  S:  Starting to get uncomfortable, amenable to AROM at this time  O:  BP 127/73   Pulse 82   Temp 97.7 F (36.5 C) (Oral)   Resp 20   Ht 5' 8 (1.727 m)   Wt (!) 175.7 kg   LMP 02/15/2024 (Exact Date)   SpO2 99%   BMI 58.89 kg/m  EFM: baseline 135 bpm/ moderate variability/ + accels/ - decels  Toco/IUPC: ctx q51min SVE: Dilation: 6 Effacement (%): 90 Station: -3, -2 Presentation: Vertex Exam by:: Dr. Jomarie Pitocin: 12 mu/min  A/P: 33 y.o. [id number] [redacted]w[redacted]d  1. Labor: AROM for clear fluid, FSE & IUPC placed 2. Pain: Planning for epidural 3. GBS positive, PCN ppx  - Anticipate SVD.  Charlie DELENA Jomarie, MD 9:25 AM

## 2024-11-22 NOTE — Discharge Summary (Addendum)
 Postpartum Discharge Summary     Patient Name: Andrea Mosley DOB: 11-16-1991 MRN: [id number]  Date of admission: 11/21/2024 Delivery date:11/22/2024 Delivering provider: JOMARIE CAMPI A Date of discharge: 11/24/2024  Admitting diagnosis: Indication for care in labor or delivery [O75.9] Intrauterine pregnancy: [redacted]w[redacted]d     Secondary diagnosis:  Principal Problem:   Indication for care in labor or delivery Active Problems:   Obesity in pregnancy   Polyhydramnios affecting pregnancy in third trimester   History of macrosomia in infant in prior pregnancy, currently pregnant, third trimester   GBS (group B Streptococcus carrier), +RV culture, currently pregnant  Additional problems: postpartum hypertension    Discharge diagnosis: Term Pregnancy Delivered and Gestational Hypertension                                              Post partum procedures:none Augmentation: AROM, Pitocin , and Cytotec  Complications: None  Hospital course: Induction of Labor With Vaginal Delivery   33 y.o. yo [id number] at [redacted]w[redacted]d was admitted to the hospital 11/21/2024 for induction of labor.  Indication for induction: elevated BMI.  Patient had an labor course that was uncomplicated Membrane Rupture Time/Date: 9:01 AM,11/22/2024  Delivery Method:Vaginal, Spontaneous Operative Delivery:N/A Episiotomy: None Lacerations:  None Details of delivery can be found in separate delivery note.  Patient had a postpartum course that was uncomplicated. Patient is discharged home 11/24/24.  Newborn Data: Birth date:11/22/2024 Birth time:12:26 PM Gender:Female Living status:Living Apgars:9 ,9  Weight:4540 g  Magnesium Sulfate received: No BMZ received: No Rhophylac:N/A MMR:N/A T-DaP:Given prenatally Flu: No RSV Vaccine received: No Transfusion:No  Immunizations received: Immunization History  Administered Date(s) Administered   Tdap 10/15/2024    Physical exam  Vitals:   11/23/24 0044 11/23/24  0515 11/23/24 2300 11/24/24 0534  BP: 129/84 124/79 127/83 110/74  Pulse: 86 72  86  Resp: 16 16 16 16   Temp: 97.7 F (36.5 C)  (!) 97.4 F (36.3 C) 97.8 F (36.6 C)  TempSrc: Axillary  Oral Axillary  SpO2: 98% 98%  98%  Weight:      Height:       General: alert, cooperative, and no distress Lochia: appropriate Uterine Fundus: firm Incision: N/A DVT Evaluation: No evidence of DVT seen on physical exam. Negative Homan's sign. No cords or calf tenderness. No significant calf/ankle edema. Labs: Lab Results  Component Value Date   WBC 6.1 11/21/2024   HGB 11.0 (L) 11/21/2024   HCT 32.5 (L) 11/21/2024   MCV 90.8 11/21/2024   PLT 230 11/21/2024      Latest Ref Rng & Units 10/15/2024    9:05 AM  CMP  Glucose 70 - 99 mg/dL 894   BUN 6 - 20 mg/dL 5   Creatinine 9.42 - 8.99 mg/dL 9.42   Sodium 865 - 855 mmol/L 139   Potassium 3.5 - 5.2 mmol/L 3.8   Chloride 96 - 106 mmol/L 105   CO2 20 - 29 mmol/L 17   Calcium 8.7 - 10.2 mg/dL 9.0   Total Protein 6.0 - 8.5 g/dL 6.9   Total Bilirubin 0.0 - 1.2 mg/dL 0.3   Alkaline Phos 41 - 116 IU/L 113   AST 0 - 40 IU/L 20   ALT 0 - 32 IU/L 12    Edinburgh Score:    11/24/2024    9:54 AM  Edinburgh Postnatal Depression Scale Screening Tool  I have  been able to laugh and see the funny side of things. 0  I have looked forward with enjoyment to things. 1  I have blamed myself unnecessarily when things went wrong. 0  I have been anxious or worried for no good reason. 0  I have felt scared or panicky for no good reason. 0  Things have been getting on top of me. 1  I have been so unhappy that I have had difficulty sleeping. 0  I have felt sad or miserable. 1  I have been so unhappy that I have been crying. 0  The thought of harming myself has occurred to me. 0  Edinburgh Postnatal Depression Scale Total 3   Edinburgh Postnatal Depression Scale Total: 3   After visit meds:  Allergies as of 11/24/2024       Reactions   Prednisone  Swelling, Rash        Medication List     STOP taking these medications    Vitamin D  (Ergocalciferol ) 1.25 MG (50000 UNIT) Caps capsule Commonly known as: DRISDOL        TAKE these medications    acetaminophen  325 MG tablet Commonly known as: Tylenol  Take 2 tablets (650 mg total) by mouth every 4 (four) hours as needed (for pain scale < 4).   benzocaine -Menthol  20-0.5 % Aero Commonly known as: DERMOPLAST Apply 1 Application topically as needed for irritation (perineal discomfort).   coconut oil Oil Apply 1 Application topically as needed.   dibucaine 1 % Oint Commonly known as: NUPERCAINAL Place 1 Application rectally as needed for hemorrhoids.   furosemide  20 MG tablet Commonly known as: LASIX  Take 1 tablet (20 mg total) by mouth daily for 2 days. Start taking on: November 25, 2024   ibuprofen  600 MG tablet Commonly known as: ADVIL  Take 1 tablet (600 mg total) by mouth every 6 (six) hours.   NIFEdipine  30 MG 24 hr tablet Commonly known as: ADALAT  CC Take 1 tablet (30 mg total) by mouth daily. Start taking on: November 25, 2024   potassium chloride  SA 20 MEQ tablet Commonly known as: KLOR-CON  M Take 1 tablet (20 mEq total) by mouth daily for 2 days. Start taking on: November 25, 2024   Prenatal Plus Vitamin/Mineral 27-1 MG Tabs Take 1 tablet by mouth daily.   witch hazel-glycerin  pad Commonly known as: TUCKS Apply 1 Application topically as needed for hemorrhoids.         Discharge home in stable condition Infant Feeding: Breast Infant Disposition:home with mother Discharge instruction: per After Visit Summary and Postpartum booklet. Activity: Advance as tolerated. Pelvic rest for 6 weeks.  Diet: routine diet Future Appointments:No future appointments. Follow up Visit:  Message sent 11/29 to [facility]  Please schedule this patient for a In person postpartum visit in 6 weeks with the following provider: Any provider. Additional Postpartum F/U:BP check  1 week  High risk pregnancy complicated by: HTN Delivery mode:  Vaginal, Spontaneous Anticipated Birth Control:  Nexplanon   11/24/2024 Darren Jernigan, DO   Attestation of Supervision of Resident: Evaluation and management procedures were performed by the learners: Family Medicine Resident under my supervision. I was immediately available for direct supervision, assistance and direction throughout this encounter.  I also confirm that I have verified the information documented in the resident's note, and that I have also personally reperformed the pertinent components of the physical exam and all of the medical decision making activities.  I have also made any necessary editorial changes.  Charlie DELENA Courts,  MD FM-OB Fellow Center for [facility]

## 2024-11-22 NOTE — Anesthesia Preprocedure Evaluation (Addendum)
 Anesthesia Evaluation  Patient identified by MRN, date of birth, ID band Patient awake    Reviewed: Allergy & Precautions, Patient's Chart, lab work & pertinent test results  Airway Mallampati: III       Dental no notable dental hx.    Pulmonary    Pulmonary exam normal        Cardiovascular negative cardio ROS Normal cardiovascular exam     Neuro/Psych    GI/Hepatic   Endo/Other    Class 4 obesity  Renal/GU      Musculoskeletal   Abdominal   Peds  Hematology   Anesthesia Other Findings   Reproductive/Obstetrics (+) Pregnancy                              Anesthesia Physical Anesthesia Plan  ASA: 3  Anesthesia Plan: Epidural   Post-op Pain Management:    Induction:   PONV Risk Score and Plan: 0  Airway Management Planned: Natural Airway  Additional Equipment: None  Intra-op Plan:   Post-operative Plan:   Informed Consent: I have reviewed the patients History and Physical, chart, labs and discussed the procedure including the risks, benefits and alternatives for the proposed anesthesia with the patient or authorized representative who has indicated his/her understanding and acceptance.       Plan Discussed with:   Anesthesia Plan Comments: (Lab Results      Component                Value               Date                      WBC                      6.1                 11/21/2024                HGB                      11.0 (L)            11/21/2024                HCT                      32.5 (L)            11/21/2024                MCV                      90.8                11/21/2024                PLT                      230                 11/21/2024           )         Anesthesia Quick Evaluation

## 2024-11-22 NOTE — Progress Notes (Signed)
 Patient ID: Andrea Mosley, female   DOB: [birth year], 33 y.o.   MRN: [id number]  Appears to be sleeping- not disturbed; s/p Fentanyl x 1 dose at 2239  BP 133/75, other VSS FHR 135-145, +accels, no decels Ctx irreg with Pit @ 82mu/min Cx deferred  IUP@40 .1wk IOL process  Previously discussed option of AROM vs Pit alone; has preferred to just use Pitocin for the time being; will continue to uptitrate to get ctx more regular  Suzen JONETTA Gentry CNM 11/22/2024 1:20 AM

## 2024-11-23 NOTE — Progress Notes (Addendum)
 CSW was consulted due to possible BUFA and elevated PHQ9 screener during MOB's initial prenatal care visit. CSW met with MOB at bedside to assess for needs and provide support. When CSW entered the room, MOB was observed sitting in hospital bed. CSW introduced self and explained reason for visit. MOB was welcoming of CSW visit, presented as calm, and remained engaged during visit.   MOB acknowledged that she was considering adoption during her pregnancy but since delivery, she has decided to parent infant. CSW inquired if MOB has all infant essentials. MOB reports she has a car seat, diapers and wipes but needs bottles. CSW provided [employer] information for bottles. When asked about a safe sleeping space for infant, MOB states she has a sleeper that sits on the bed. CSW explained how it is recommended that infant sleep in a separate sleeping space and offered to bring a pack n play. MOB expressed appreciation, CSW to provide pack n play prior to discharge. CSW inquired about a pediatrician for infant. MOB states she is undecided on a pediatrician but has a list.  CSW inquired about MOB's mental health history. MOB denies a history of mental health diagnoses. MOB reports feeling disconnected during her pregnancy and experienced feelings of stress upon finding out about her pregnancy but feels she has made peace with the decision to parent infant. MOB identified her mother as her primary support. CSW assessed for safety. MOB denied current SIHI/domestic violence.  CSW provided education regarding the baby blues period vs. perinatal mood disorders, discussed treatment and gave resources for mental health follow up if concerns arise. CSW recommends self-evaluation during the postpartum time period using the New Mom Checklist from Postpartum Progress and encouraged MOB to contact a medical professional if symptoms are noted at any time.    CSW provided review of Sudden Infant Death Syndrome (SIDS)  precautions.    CSW inquired about additional resource needs. MOB inquired if infant can be cared for in the nursery so that MOB can sleep, as infant cluster fed overnight. CSW consulted nursing staff and MOB was informed to notify her RN if she would like infant to be brought to the nursery. MOB expressed appreciation.   CSW identifies no further need for intervention and no barriers to discharge at this time.  Signed,  Sharyne LOIS Roulette, MSW, LCSWA, LCASA 11/23/2024 11:24 AM

## 2024-11-23 NOTE — Progress Notes (Signed)
 Post Partum Day 1 Subjective: Patient reports feeling well and is without complaints. She ambulated, voided and tolerated a regular diet  Objective: Blood pressure 124/79, pulse 72, temperature 97.7 F (36.5 C), temperature source Axillary, resp. rate 16, height 5' 8 (1.727 m), weight (!) 175.7 kg, last menstrual period 02/15/2024, SpO2 98%, unknown if currently breastfeeding.  Physical Exam:  General: alert, cooperative, and no distress Lochia: appropriate Uterine Fundus: firm Incision: n/a DVT Evaluation: No evidence of DVT seen on physical exam.  Recent Labs    11/21/24 0953  HGB 11.0*  HCT 32.5*    Assessment/Plan: Plan for discharge tomorrow, Breastfeeding, and Circumcision prior to discharge Continue procardia and lasix for postpartum HTN. BP stable   LOS: 2 days   Andrea Felt, MD 11/23/2024, 11:16 AM

## 2024-11-23 NOTE — Anesthesia Postprocedure Evaluation (Signed)
 Anesthesia Post Note  Patient: Andrea Mosley  Procedure(s) Performed: AN AD HOC LABOR EPIDURAL     Patient location during evaluation: Mother Baby Anesthesia Type: Epidural Level of consciousness: awake and alert Pain management: pain level controlled Vital Signs Assessment: post-procedure vital signs reviewed and stable Respiratory status: spontaneous breathing, nonlabored ventilation and respiratory function stable Cardiovascular status: stable Postop Assessment: no headache, no backache, epidural receding, no apparent nausea or vomiting, patient able to bend at knees, able to ambulate and adequate PO intake Anesthetic complications: no   No notable events documented.  Last Vitals:  Vitals:   11/23/24 0044 11/23/24 0515  BP: 129/84 124/79  Pulse: 86 72  Resp: 16 16  Temp: 36.5 C   SpO2: 98% 98%    Last Pain:  Vitals:   11/23/24 0515  TempSrc:   PainSc: 5    Pain Goal:                   Thomos Janetta Lout

## 2024-11-23 NOTE — Lactation Note (Signed)
 This note was copied from a baby's chart. Lactation Consultation Note  Patient Name: Andrea Mosley Unijb'd Date: 11/23/2024 Age:31 hours   Late entry for 2200  11/22/24 Assisted mom in latching mom stated baby was tongue thrusting and hurting. LC watched latch. Baby was getting on shallow. Helped mom w.positioning, support, flanging lips. Baby latched well and BF great. Mom very pleased how the feeding is going. Newborn feeding habits, STS, I&O, milk storage. Mom encouraged to feed baby 8-12 times/24 hours and with feeding cues.  Encouraged mom to call for [facility] when she needs assistance. Maternal Data    Feeding    LATCH Score                    Lactation Tools Discussed/Used    Interventions    Discharge    Consult Status      [name] 11/23/2024, 5:34 AM

## 2024-11-24 ENCOUNTER — Other Ambulatory Visit (HOSPITAL_COMMUNITY): Payer: Self-pay

## 2024-11-24 MED ORDER — ACETAMINOPHEN 325 MG PO TABS
650.0000 mg | ORAL_TABLET | ORAL | 0 refills | Status: AC | PRN
  Filled 2024-11-24: qty 30, 3d supply, fill #0

## 2024-11-24 MED ORDER — WITCH HAZEL-GLYCERIN EX PADS: 1.0000 | MEDICATED_PAD | CUTANEOUS | Status: DC | PRN

## 2024-11-24 MED ORDER — DIBUCAINE (PERIANAL) 1 % EX OINT: 1.0000 | TOPICAL_OINTMENT | CUTANEOUS | Status: DC | PRN

## 2024-11-24 MED ORDER — POTASSIUM CHLORIDE CRYS ER 20 MEQ PO TBCR
20.0000 meq | EXTENDED_RELEASE_TABLET | Freq: Every day | ORAL | 0 refills | Status: DC
  Filled 2024-11-24: qty 2, 2d supply, fill #0

## 2024-11-24 MED ORDER — IBUPROFEN 600 MG PO TABS
600.0000 mg | ORAL_TABLET | Freq: Four times a day (QID) | ORAL | 0 refills | Status: AC
  Filled 2024-11-24: qty 30, 8d supply, fill #0

## 2024-11-24 MED ORDER — COCONUT OIL OIL: 1.0000 | TOPICAL_OIL | Status: AC | PRN

## 2024-11-24 MED ORDER — FUROSEMIDE 20 MG PO TABS
20.0000 mg | ORAL_TABLET | Freq: Every day | ORAL | 0 refills | Status: DC
  Filled 2024-11-24: qty 2, 2d supply, fill #0

## 2024-11-24 MED ORDER — BENZOCAINE-MENTHOL 20-0.5 % EX AERO: 1.0000 | INHALATION_SPRAY | CUTANEOUS | Status: DC | PRN

## 2024-11-24 MED ORDER — NIFEDIPINE ER 30 MG PO TB24
30.0000 mg | ORAL_TABLET | Freq: Every day | ORAL | 0 refills | Status: DC
  Filled 2024-11-24: qty 90, 90d supply, fill #0

## 2024-11-24 NOTE — Patient Instructions (Signed)

## 2024-11-24 NOTE — Lactation Note (Addendum)
 This note was copied from a baby's chart. Lactation Consultation Note  Patient Name: Boy Maigen Mozingo Unijb'd Date: 11/24/2024 Age:32 hours Reason for consult: Follow-up assessment;Term  P4,  Ezella has been cluster feeding and mother states she needed to sleep so baby was supplemented with formula this morning. Discussed offering both breast with each feeding to help baby become more satisfied with feedings since her baby has been latching on only one breast per feeding. She has a positional stripe on her R nipple.  She has coconut oil. Assisted with latching in cross cradle hold.  Mother sandwiched her breast but baby did not wake to feed.  Discussed the importance of depth. Did note that baby has short mid anterior lingual frenulum and gags easily when oral assessment performed. LC was unable to evaluate tongue protrusion. Mother is aware to pump if she is supplementing and appears to have ample colostrum at this time. Reviewed engorgement care and monitoring voids/stools. Suggest calling for help today as needed.  Maternal Data Has patient been taught Hand Expression?: Yes  Feeding Mother's Current Feeding Choice: Breast Milk and Formula  Lactation Tools Discussed/Used Tools: Pump;Coconut oil  Interventions Interventions: Breast feeding basics reviewed;Assisted with latch;Hand express;Education;Hand pump  Discharge Discharge Education: Engorgement and breast care;Warning signs for feeding baby Pump: Personal;Hands Free;DEBP [place])  Consult Status Consult Status: Complete Date: 11/24/24    Shannon Dines [facility] 11/24/2024, 8:56 AM

## 2024-11-26 ENCOUNTER — Encounter: Admitting: Certified Nurse Midwife

## 2024-12-01 ENCOUNTER — Ambulatory Visit

## 2024-12-01 ENCOUNTER — Other Ambulatory Visit: Payer: Self-pay

## 2024-12-01 VITALS — BP 125/95 | HR 90 | Wt 348.0 lb

## 2024-12-01 DIAGNOSIS — Z0130 Encounter for examination of blood pressure without abnormal findings: Secondary | ICD-10-CM

## 2024-12-01 MED ORDER — BLOOD PRESSURE KIT DEVI: 1.0000 | 0 refills | Status: AC

## 2024-12-01 NOTE — Progress Notes (Signed)
 Blood Pressure Check Visit  Andrea Mosley is here for blood pressure check following induced vaginal birth on 11/22/24. BP today is 125/95. Patient denies/endorses any peripheral edema.  Patient is reporting headache whenever she takes the Nifedipine  30 mg 24 hr tablet; she did not take the medication today and she does not have a headache and is requesting to stop the medication.  She prefers to not take an alternate medication either if that is possible.  Discussed with Dr. Danny; plan is to send a prescription for a blood pressure monitor for home use to monitor and see the patient for a blood pressure check in 1 week.  Patient will stop taking the blood pressure medication with close monitoring/follow-up.  MAU precautions discussed with patient.  Rosina, RN

## 2024-12-01 NOTE — Progress Notes (Signed)
 Met with patient while in office for BP Check. Infant is a week old. He is latching well with feeds. Mom reports pain with initial latch, reviewed this can be common for 2-3 weeks post delivery. She flanges lips as needed after latch.   She reports infant seems not to be as satisfied as he was. She reports he feeds every 30 minutes to every 3 hours. She offers both breasts with each feeding. Infant is voiding and stooling with each feeding and has yellow seedy stools. Reviewed normalcy of growth spurts. He is to have a weight check at home tomorrow.   Mom reports she is pumping 2 x a day, sometimes before feeding and sometimes after, reviewed to only pump after he feeds at the breast. She is using a Zomee pump and single pumping, reviewed that double pumping may yield more milk by increasing Prolactin levels.   Mom was informed of OP Lactation and of BF Support Groups. She has an OP Lactation appointment scheduled and plans to come to [facility] next week.   She is reporting a tingling in her breasts today, infant fed 1-2 hours ago. Reviewed letdown and filling pains that can happen.   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 Reviewed supply and demand and importance of emptying the breasts at least every 2-3 hours. Reviewed speaking with management about pumping time while at work.   Patient will call with any questions or concerns as needed.   Lactation Cookie Recipe given.

## 2024-12-03 ENCOUNTER — Telehealth: Payer: Self-pay | Admitting: General Practice

## 2024-12-03 ENCOUNTER — Ambulatory Visit

## 2024-12-03 ENCOUNTER — Encounter: Payer: Self-pay | Admitting: *Deleted

## 2024-12-03 NOTE — Telephone Encounter (Signed)
 Nikki from [employer] called and left message on nurse voice mail line stating the patient was seen in our office 2 days ago for a blood pressure check and the patient reported a headache from the nifedipine . We discontinued Nifedipine  and recommended an alternative but the patient declined. She saw her for an in home visit and her blood pressure was 168/102 & 164/100. She discussed going to MAU with her but the patient stated she would just monitor symptoms. Discussed with Cornell Finder who recommended returning to office for BP check or starting Norvasc as it isn't known for causing headaches like Nifedipine . Called patient and discussed with her. She prefers to come in for BP check. Offered appt this afternoon around 340 & she accepts.

## 2024-12-04 ENCOUNTER — Ambulatory Visit

## 2024-12-04 VITALS — BP 124/91 | HR 94 | Ht 68.0 in | Wt 345.4 lb

## 2024-12-04 DIAGNOSIS — O133 Gestational [pregnancy-induced] hypertension without significant proteinuria, third trimester: Secondary | ICD-10-CM

## 2024-12-04 DIAGNOSIS — Z0130 Encounter for examination of blood pressure without abnormal findings: Secondary | ICD-10-CM

## 2024-12-04 DIAGNOSIS — O135 Gestational [pregnancy-induced] hypertension without significant proteinuria, complicating the puerperium: Secondary | ICD-10-CM | POA: Diagnosis not present

## 2024-12-04 NOTE — Progress Notes (Signed)
 Subjective:  Kathaleen Dudziak is a H4E5985 here for BP check.  She delivered 11/22/24 Induced vaginal delivery. Hx Gestational Hypertension.   Hypertension ROS: Patient endorse a headache; thinks its related to not eating/hydrating. Patient denies visual changes, abdominal pain, and peripheral edema.   BP 118/91 Recheck 124/91  Objective:  LMP 02/15/2024 (Exact Date)   Appearance alert, well appearing, and in no distress.  Assessment:   Patient is not taking recommended BP medication; but agrees to start Norvasc.  Plan:  Follow up BP check 12/12/24. Reviewed chart with provider. Hur, DO in to see patient. Patient advised to monitor BP at home. Patient states she will pick up BP cuff today if pharmacy has it available. Reviewed MAU precautions with patient and she verbalized understanding. Patient needs to start Norvasc.    Devon, RN 12/04/24

## 2024-12-06 MED ORDER — AMLODIPINE BESYLATE 5 MG PO TABS: 5.0000 mg | ORAL_TABLET | Freq: Every day | ORAL | 0 refills | Status: AC

## 2024-12-06 NOTE — Progress Notes (Signed)
 Discussed with patient recommendation for antiHTN medication. I explained that I am the same physician who said we could trial one week off meds but that her bp indicate the need for treatment. She has been having bp checked at home by RN.   BP (!) 124/91   Pulse 94   Ht 5' 8 (1.727 m)   Wt (!) 345 lb 6.4 oz (156.7 kg)   LMP 02/15/2024 (Exact Date)   Breastfeeding Yes   BMI 52.52 kg/m    1. Gestational hypertension, third trimester Ddx: gestational HTN, chronic HTN. Continues to have bp >135/85 at office visit. Reports severe range bp at home. Experienced HA with nifedipine , will rx nifedipine  instead. She will get bp cuff and check bp daily, RTO 1 week. Standard 6wk PP visit already scheduled.    Barabara Maier, DO FM-OB Fellow Center for [employer]

## 2024-12-12 ENCOUNTER — Ambulatory Visit

## 2024-12-12 ENCOUNTER — Ambulatory Visit: Admitting: Lactation Services

## 2024-12-12 VITALS — BP 123/82 | HR 86 | Wt 349.6 lb

## 2024-12-12 VITALS — BP 123/82 | Wt 349.6 lb

## 2024-12-12 DIAGNOSIS — O133 Gestational [pregnancy-induced] hypertension without significant proteinuria, third trimester: Secondary | ICD-10-CM

## 2024-12-12 NOTE — Progress Notes (Signed)
 Patient here for Lactation visit. BP was normal today. She reports she has not started the Norvasc . Her BP is generally normal in the office, not when taken at home by [facility]. She felt it may be more harm than good. Reviewed that we want to keep her BP stable.   She denies headache, blurred vision or dizziness.   She has returned to work. She has no questions or concerns today. She has follow up for PP appt.

## 2024-12-12 NOTE — Progress Notes (Unsigned)
 Patient here for Lactation visit. BP was normal today. She reports she has not started the Norvasc . Her BP is generally normal in the office, not when taken at home by [facility].   She denies headache, blurred vision or dizziness.   She has returned to work. She has no questions or concerns today. She has follow up for PP appt.

## 2024-12-31 ENCOUNTER — Other Ambulatory Visit: Payer: Self-pay

## 2024-12-31 ENCOUNTER — Ambulatory Visit (INDEPENDENT_AMBULATORY_CARE_PROVIDER_SITE_OTHER): Admitting: Certified Nurse Midwife

## 2024-12-31 ENCOUNTER — Encounter: Payer: Self-pay | Admitting: Certified Nurse Midwife

## 2024-12-31 DIAGNOSIS — N816 Rectocele: Secondary | ICD-10-CM

## 2024-12-31 NOTE — Progress Notes (Unsigned)
 "  Postpartum Visit Note  Andrea Mosley is a 34 y.o. [phone] female who presents for a postpartum visit. She is [redacted]w[redacted]d postpartum following a normal spontaneous vaginal delivery.  I have fully reviewed the prenatal and intrapartum course. The delivery was at [redacted]w[redacted]d.  Anesthesia: epidural. Postpartum course has been uncomplicated. Baby is doing well. Baby is feeding by both breast and bottle - Enfamil Ready to feed. Bleeding no bleeding. Bowel function is abnormal: constipation with what feels like a posterior vaginal wall/rectal prolapse. Experienced some constipation and felt the posterior wall of her vagina bulging out as she tried to pass stool. Has been better with stool softeners and regularity, but she is concerned. Bladder function is normal. Patient is not sexually active. Contraception method is none. Postpartum depression screening: negative.  The pregnancy intention screening data noted above was reviewed. Potential methods of contraception were discussed. The patient elected to proceed with Abstinence.   Edinburgh Postnatal Depression Scale - 12/31/24 1433       Edinburgh Postnatal Depression Scale:  In the Past 7 Days   I have been able to laugh and see the funny side of things. 3    I have looked forward with enjoyment to things. 0    I have blamed myself unnecessarily when things went wrong. 0    I have been anxious or worried for no good reason. 0    I have felt scared or panicky for no good reason. 0    Things have been getting on top of me. 2    I have been so unhappy that I have had difficulty sleeping. 0    I have felt sad or miserable. 1    I have been so unhappy that I have been crying. 0    The thought of harming myself has occurred to me. 0    Edinburgh Postnatal Depression Scale Total 6         Health Maintenance Due  Topic Date Due   Hepatitis B Vaccines 19-59 Average Risk (1 of 3 - 19+ 3-dose series) Never done   HPV VACCINES (1 - 3-dose SCDM series) Never  done   COVID-19 Vaccine (1 - 2025-26 season) Never done   The following portions of the patient's history were reviewed and updated as appropriate: allergies, current medications, past family history, past medical history, past social history, past surgical history, and problem list.  Review of Systems Pertinent items noted in HPI and remainder of comprehensive ROS otherwise negative.  Objective:  BP 108/77   Pulse 91   Wt (!) 349 lb 1.6 oz (158.4 kg)   LMP 02/15/2024 (Exact Date)   Breastfeeding Yes   BMI 53.08 kg/m    Constitutional: Alert, oriented female in no physical distress.  HEENT: PERRLA Skin: normal color and turgor, no rash Cardiovascular: normal rate & rhythm Respiratory: normal effort, no problems with respiration noted GI: Abd soft, non-tender MS: Extremities nontender, no edema, normal ROM Neurologic: Alert and oriented x 4.  GU: no CVA tenderness Pelvic: exam deferred Breasts: normal lactating breasts, no edema or signs of nipple damage  Assessment:  1. Postpartum care and examination (Primary) - Recovering well, happily no longer with the FOB and other kids are adjusting well and very helpful  2. Posterior vaginal wall prolapse - Ambulatory referral to Physical Therapy    Plan:   Essential components of care per ACOG recommendations:  1.  Mood and well being: Patient with negative depression screening today. Reviewed local  resources for support.  - Patient tobacco use? No.   - hx of drug use? No.    2. Infant care and feeding:  -Patient currently breastmilk feeding? Yes. Discussed returning to work and pumping. Reviewed importance of draining breast regularly to support lactation.  -Social determinants of health (SDOH) reviewed in EPIC. No concerns   3. Sexuality, contraception and birth spacing - Patient does not want a pregnancy in the next year.  Desired family size is 4 children.  - Reviewed reproductive life planning. Reviewed contraceptive  methods based on pt preferences and effectiveness.  Patient desired Abstinence today.   - Discussed birth spacing of 18 months  4. Sleep and fatigue -Encouraged family/partner/community support of 4 hrs of uninterrupted sleep to help with mood and fatigue  5. Physical Recovery  - Discussed patients delivery and complications. She describes her labor as good. - Patient had a Vaginal, no problems at delivery. Patient had no laceration. Perineal healing reviewed. Patient expressed understanding - Patient has urinary incontinence? No. - Patient is safe to resume physical and sexual activity  6.  Health Maintenance - HM due items addressed Yes - Last pap smear 08/07/22 NILM, neg HPV. Pap smear not done at today's visit.  -Breast Cancer screening indicated? No.   7. Chronic Disease/Pregnancy Condition follow up: vaginal wall prolapse - PCP follow up as needed  Cornell JONELLE Finder, CNM [facility] [employer], [facility]  "

## 2025-01-04 ENCOUNTER — Encounter: Payer: Self-pay | Admitting: Certified Nurse Midwife

## 2025-01-07 ENCOUNTER — Ambulatory Visit: Attending: Certified Nurse Midwife | Admitting: Physical Therapy

## 2025-01-07 ENCOUNTER — Other Ambulatory Visit: Payer: Self-pay

## 2025-01-07 DIAGNOSIS — R279 Unspecified lack of coordination: Secondary | ICD-10-CM | POA: Insufficient documentation

## 2025-01-07 DIAGNOSIS — R293 Abnormal posture: Secondary | ICD-10-CM | POA: Diagnosis present

## 2025-01-07 DIAGNOSIS — M6281 Muscle weakness (generalized): Secondary | ICD-10-CM | POA: Diagnosis present

## 2025-01-07 DIAGNOSIS — N816 Rectocele: Secondary | ICD-10-CM | POA: Insufficient documentation

## 2025-01-07 NOTE — Therapy (Signed)
 "  OUTPATIENT PHYSICAL THERAPY FEMALE PELVIC EVALUATION   Patient Name: Andrea Mosley MRN: [id number] DOB:[birth year], 34 y.o., female Today's Date: 01/07/2025  END OF SESSION:  PT End of Session - 01/07/25 1541     Visit Number 1    Date for Recertification  07/07/25    Authorization Type [employer]    PT Start Time 1245   arrival   PT Stop Time 1313    PT Time Calculation (min) 28 min    Activity Tolerance Patient tolerated treatment well    Behavior During Therapy [facility] for tasks assessed/performed          Past Medical History:  Diagnosis Date   GBS (group B Streptococcus carrier), +RV culture, currently pregnant 11/12/2024   Treat in labor     Medical history non-contributory    Past Surgical History:  Procedure Laterality Date   OPEN REDUCTION SHOULDER DISLOCATION Right 10/30/2016   There are no active problems to display for this patient.   PCP: Stroud, Natalie M, FNP   REFERRING PROVIDER: Vannie Cornell SAUNDERS, CNM  REFERRING DIAG: N81.6 (ICD-10-CM) - Posterior vaginal wall prolapse  THERAPY DIAG:  Muscle weakness (generalized)  Unspecified lack of coordination  Abnormal posture  Rationale for Evaluation and Treatment: Rehabilitation  ONSET DATE: 3 weeks ago   SUBJECTIVE:                                                                                                                                                                                           SUBJECTIVE STATEMENT:  Postpartum constipation about 3 weeks started feeling vaginal pressure with attempts to have bowel movement and straining. Now feeling more pressure vaginally even without bowel movements.  Started feeling more joint popping with laying down.   Fluid intake: water - 4 (16oz) bottles per day, body armor   FUNCTIONAL LIMITATIONS: bowel movements  PERTINENT HISTORY:  Medications for current condition: no Surgeries: no  Other: 4 children, 12yo, 10yo, 8 yo, 6 weeks Sexual  abuse: No  PAIN:  Are you having pain? No   PRECAUTIONS: Other: postpartum   RED FLAGS: None   WEIGHT BEARING RESTRICTIONS: No  FALLS:  Has patient fallen in last 6 months? No  OCCUPATION: teacher middle school   ACTIVITY LEVEL : low  PLOF: Independent  PATIENT GOALS: to have no pelvic pressure or symptoms    BOWEL MOVEMENT: Pain with bowel movement: No Type of bowel movement:Type (Bristol Stool Scale) 4, Frequency every other day, and Strain no Fully empty rectum: Yes:   Leakage: No  Caused by:  Bowel urgency: no Pads: No Fiber supplement/laxative No  URINATION: Pain with urination: No Fully empty bladder: Yes:                                           Post-void dribble: No Stream: Strong Urgency: No Frequency:not quicker than every 2 hours                                                    Nocturia: No   Leakage: none Pads/briefs: No  INTERCOURSE:  Not returned   PREGNANCY: Vaginal deliveries 4 Tearing Yes: minimal first C-section deliveries 0 Currently pregnant No  PROLAPSE: Pressure   OBJECTIVE:  Note: Objective measures were completed at Evaluation unless otherwise noted.    PATIENT SURVEYS:   PFIQ-7: 10   COGNITION: Overall cognitive status: Within functional limits for tasks assessed     SENSATION: Light touch: Appears intact   FUNCTIONAL TESTS:   Single leg stance:mild instability on Rt 6s, 3s Lt with hip drop bil  Squat:bil   GAIT: WFL  POSTURE: rounded shoulders, forward head, and posterior pelvic tilt   LUMBARAROM/PROM:  A/PROM A/PROM  Eval (% available)  Flexion 100  Extension 100  Right lateral flexion 100  Left lateral flexion 100  Right rotation 75  Left rotation 75   (Blank rows = not tested)  LOWER EXTREMITY ROM:  Bil hamstrings and adductors limited by 25%  LOWER EXTREMITY MMT:  Bil hips grossly 4/5 PALPATION:  General: mild tension in thoracic  paraspinals    Abdominal: mild tension in lower abdominals   Diastasis: Yes:  mild Distortion: No with bed mob                 External Perineal Exam: [facility]                             Internal Pelvic Floor: no TTP   Patient confirms identification and approves PT to assess internal pelvic floor and treatment Yes No emotional/communication barriers or cognitive limitation. Patient is motivated to learn. Patient understands and agrees with treatment goals and plan. PT explains patient will be examined in standing, sitting, and lying down to see how their muscles and joints work. When they are ready, they will be asked to remove their underwear so PT can examine their perineum. The patient is also given the option of providing their own chaperone as one is not provided in our facility. The patient also has the right and is explained the right to defer or refuse any part of the evaluation or treatment including the internal exam. With the patient's consent, PT will use one gloved finger to gently assess the muscles of the pelvic floor, seeing how well it contracts and relaxes and if there is muscle symmetry. After, the patient will get dressed and PT and patient will discuss exam findings and plan of care. PT and patient discuss plan of care, schedule, attendance policy and HEP activities.  All internal or external pelvic floor assessments and/or treatments are completed with proper hand hygiene and gloves hands. If needed gloves are changed with hand hygiene during patient care time.  PELVIC MMT:  MMT eval  Vaginal 3/5,6s, 5 reps  Internal Anal Sphincter   External Anal Sphincter   Puborectalis   (Blank rows = not tested)        TONE: Decreased    PROLAPSE: Not seen in hooklying, pt concerned about this and pt offered standing assessment as well - pt agree, no laxity visualized but did feel possible slight anterior laxity no posterior. All with cough   TODAY'S TREATMENT:                                                                                                                               DATE:   01/07/25 EVAL Examination completed, findings reviewed, pt educated on POC, HEP, . Pt motivated to participate in PT and agreeable to attempt recommendations.     PATIENT EDUCATION:  Education details: [id number] Person educated: Patient Education method: Explanation Education comprehension: verbalized understanding, returned demonstration, verbal cues required, tactile cues required, and needs further education  HOME EXERCISE PROGRAM: [id number]  ASSESSMENT:  CLINICAL IMPRESSION: Patient is a 34 y.o. female  who was seen today for physical therapy evaluation and treatment for postpartum with 4th baby all vaginally. Pt reports since 3 weeks postpartum has felt laxity in vaginal canal with bowel movements. Still present now 6 weeks postpartum pt reports she didn't have pelvic exam at postpartum appointment. Pt found to have weakness at core and hips, mild pelvic instability with single leg stance bil but no LOB. Patient consented to internal pelvic floor assessment vaginally this date and found to have decreased strength, endurance, and coordination. Did not see vaginal laxity in hooklying or standing but in standing did feel slight possible anterior laxity. Pt would benefit from additional PT to further address deficits.    OBJECTIVE IMPAIRMENTS: decreased activity tolerance, decreased coordination, decreased endurance, decreased ROM, decreased strength, increased fascial restrictions, impaired perceived functional ability, increased muscle spasms, impaired flexibility, improper body mechanics, and postural dysfunction.   ACTIVITY LIMITATIONS: continence  PARTICIPATION LIMITATIONS: bowel movements  PERSONAL FACTORS: 1 comorbidity: postpartum  are also affecting patient's functional outcome.   REHAB POTENTIAL: Good  CLINICAL DECISION MAKING: Stable/uncomplicated  EVALUATION  COMPLEXITY: Low   GOALS: Goals reviewed with patient? Yes  SHORT TERM GOALS: Target date: 02/04/25  Pt to be I with HEP for carry over and continuing recommendations for improved outcomes.   Baseline: Goal status: INITIAL  2.  Pt will be able to correctly perform diaphragmatic breathing and appropriate pressure management in order to prevent worsening vaginal wall laxity and improve pelvic floor A/ROM.   Baseline:  Goal status: INITIAL   LONG TERM GOALS: Target date: 04/07/25  Pt to be I with advanced HEP for carry over and continuing recommendations for improved outcomes.   Baseline:  Goal status: INITIAL  2. Pt to demonstrate improved coordination of pelvic floor and breathing mechanics with 15# squat with appropriate synergistic patterns to decrease pain and leakage at least 75% of the time  for improved ability to complete a 30 minute walk with strain at pelvic floor pressure.    Baseline:  Goal status: INITIAL  3.  Pt to report no more than 2/10 pain with vaginal penetration to improve tolerance to returning to intercourse postpartum.  Baseline:  Goal status: INITIAL  4.  Pt will report her bowel movements  are complete at least 75% of the time due to improved bowel habits and evacuation techniques.  Baseline:  Goal status: INITIAL  5.  Pt to demonstrate at least 30 mins upright midline posture without compensation for decreased strain at pelvic floor and improved tolerance to return to work.  Baseline:  Goal status: INITIAL  PLAN:  PT FREQUENCY: 1x/week  PT DURATION: 8 sessions  PLANNED INTERVENTIONS: 97110-Therapeutic exercises, 97530- Therapeutic activity, 97112- Neuromuscular re-education, 97535- Self Care, [id number]- Manual therapy, [phone]- Canalith repositioning, [id number]- Aquatic Therapy, [phone]- Electrical stimulation (manual), [id number]- Vasopneumatic device, [phone] (1-2 muscles), [id number] (3+ muscles)- Dry Needling, Patient/Family education, Taping, Joint mobilization, Spinal  mobilization, Scar mobilization, DME instructions, Cryotherapy, Moist heat, and Biofeedback  PLAN FOR NEXT SESSION: core and hip strengthening with coordination of pelvic floor, pelvic floor strengthening, pressure management    Darryle GORMAN [employer], PT 01/07/2025, 3:43 PM  "

## 2025-02-09 ENCOUNTER — Ambulatory Visit: Admitting: Physical Therapy

## 2025-02-18 ENCOUNTER — Ambulatory Visit: Admitting: Physical Therapy

## 2025-02-25 ENCOUNTER — Ambulatory Visit: Admitting: Physical Therapy

## 2025-03-04 ENCOUNTER — Ambulatory Visit: Admitting: Physical Therapy
